# Patient Record
Sex: Female | Born: 1985 | Race: White | Hispanic: No | Marital: Married | Smoking: Never smoker
Health system: Southern US, Community
[De-identification: ages and names within clinical notes are randomized; demographics above are authoritative.]

## PROBLEM LIST (undated history)

## (undated) ENCOUNTER — Inpatient Hospital Stay (HOSPITAL_COMMUNITY): Payer: Self-pay

## (undated) DIAGNOSIS — C439 Malignant melanoma of skin, unspecified: Secondary | ICD-10-CM

## (undated) DIAGNOSIS — D6800 Von Willebrand disease, unspecified: Secondary | ICD-10-CM

## (undated) DIAGNOSIS — Z8619 Personal history of other infectious and parasitic diseases: Secondary | ICD-10-CM

## (undated) HISTORY — DX: Von Willebrand's disease: D68.0

## (undated) HISTORY — DX: Personal history of other infectious and parasitic diseases: Z86.19

## (undated) HISTORY — DX: Malignant melanoma of skin, unspecified: C43.9

## (undated) HISTORY — DX: Von Willebrand disease, unspecified: D68.00

---

## 2013-08-10 ENCOUNTER — Encounter: Payer: Self-pay | Admitting: Obstetrics & Gynecology

## 2013-08-16 ENCOUNTER — Encounter: Payer: Self-pay | Admitting: Family Medicine

## 2013-08-16 ENCOUNTER — Ambulatory Visit (INDEPENDENT_AMBULATORY_CARE_PROVIDER_SITE_OTHER): Payer: BC Managed Care – PPO | Admitting: Family Medicine

## 2013-08-16 VITALS — BP 126/73 | HR 100 | Ht 61.0 in | Wt 105.0 lb

## 2013-08-16 DIAGNOSIS — D680 Von Willebrand's disease: Secondary | ICD-10-CM

## 2013-08-16 DIAGNOSIS — Z01419 Encounter for gynecological examination (general) (routine) without abnormal findings: Secondary | ICD-10-CM

## 2013-08-16 DIAGNOSIS — Z124 Encounter for screening for malignant neoplasm of cervix: Secondary | ICD-10-CM

## 2013-08-16 LAB — COMPREHENSIVE METABOLIC PANEL
AST: 13 U/L (ref 0–37)
Alkaline Phosphatase: 49 U/L (ref 39–117)
BUN: 16 mg/dL (ref 6–23)
Glucose, Bld: 74 mg/dL (ref 70–99)
Potassium: 3.8 mEq/L (ref 3.5–5.3)
Sodium: 137 mEq/L (ref 135–145)
Total Bilirubin: 0.3 mg/dL (ref 0.3–1.2)
Total Protein: 7.9 g/dL (ref 6.0–8.3)

## 2013-08-16 LAB — CBC
Hemoglobin: 12.6 g/dL (ref 12.0–15.0)
MCH: 30.4 pg (ref 26.0–34.0)
MCHC: 33.8 g/dL (ref 30.0–36.0)
Platelets: 375 10*3/uL (ref 150–400)
RBC: 4.15 MIL/uL (ref 3.87–5.11)
RDW: 12.6 % (ref 11.5–15.5)

## 2013-08-16 LAB — LIPID PANEL
Cholesterol: 174 mg/dL (ref 0–200)
HDL: 67 mg/dL (ref >39)
LDL Cholesterol: 87 mg/dL (ref 0–99)
Total CHOL/HDL Ratio: 2.6 Ratio
VLDL: 20 mg/dL (ref 0–40)

## 2013-08-16 NOTE — Patient Instructions (Signed)
Preventive Care for Adults, Female A healthy lifestyle and preventive care can promote health and wellness. Preventive health guidelines for women include the following key practices.  A routine yearly physical is a good way to check with your caregiver about your health and preventive screening. It is a chance to share any concerns and updates on your health, and to receive a thorough exam.  Visit your dentist for a routine exam and preventive care every 6 months. Brush your teeth twice a day and floss once a day. Good oral hygiene prevents tooth decay and gum disease.  The frequency of eye exams is based on your age, health, family medical history, use of contact lenses, and other factors. Follow your caregiver's recommendations for frequency of eye exams.  Eat a healthy diet. Foods like vegetables, fruits, whole grains, low-fat dairy products, and lean protein foods contain the nutrients you need without too many calories. Decrease your intake of foods high in solid fats, added sugars, and salt. Eat the right amount of calories for you.Get information about a proper diet from your caregiver, if necessary.  Regular physical exercise is one of the most important things you can do for your health. Most adults should get at least 150 minutes of moderate-intensity exercise (any activity that increases your heart rate and causes you to sweat) each week. In addition, most adults need muscle-strengthening exercises on 2 or more days a week.  Maintain a healthy weight. The body mass index (BMI) is a screening tool to identify possible weight problems. It provides an estimate of body fat based on height and weight. Your caregiver can help determine your BMI, and can help you achieve or maintain a healthy weight.For adults 20 years and older:  A BMI below 18.5 is considered underweight.  A BMI of 18.5 to 24.9 is normal.  A BMI of 25 to 29.9 is considered overweight.  A BMI of 30 and above is  considered obese.  Maintain normal blood lipids and cholesterol levels by exercising and minimizing your intake of saturated fat. Eat a balanced diet with plenty of fruit and vegetables. Blood tests for lipids and cholesterol should begin at age 20 and be repeated every 5 years. If your lipid or cholesterol levels are high, you are over 50, or you are at high risk for heart disease, you may need your cholesterol levels checked more frequently.Ongoing high lipid and cholesterol levels should be treated with medicines if diet and exercise are not effective.  If you smoke, find out from your caregiver how to quit. If you do not use tobacco, do not start.  Lung cancer screening is recommended for adults aged 55 80 years who are at high risk for developing lung cancer because of a history of smoking. Yearly low-dose computed tomography (CT) is recommended for people who have at least a 30-pack-year history of smoking and are a current smoker or have quit within the past 15 years. A pack year of smoking is smoking an average of 1 pack of cigarettes a day for 1 year (for example: 1 pack a day for 30 years or 2 packs a day for 15 years). Yearly screening should continue until the smoker has stopped smoking for at least 15 years. Yearly screening should also be stopped for people who develop a health problem that would prevent them from having lung cancer treatment.  If you are pregnant, do not drink alcohol. If you are breastfeeding, be very cautious about drinking alcohol. If you are   not pregnant and choose to drink alcohol, do not exceed 1 drink per day. One drink is considered to be 12 ounces (355 mL) of beer, 5 ounces (148 mL) of wine, or 1.5 ounces (44 mL) of liquor.  Avoid use of street drugs. Do not share needles with anyone. Ask for help if you need support or instructions about stopping the use of drugs.  High blood pressure causes heart disease and increases the risk of stroke. Your blood pressure  should be checked at least every 1 to 2 years. Ongoing high blood pressure should be treated with medicines if weight loss and exercise are not effective.  If you are 55 to 28 years old, ask your caregiver if you should take aspirin to prevent strokes.  Diabetes screening involves taking a blood sample to check your fasting blood sugar level. This should be done once every 3 years, after age 45, if you are within normal weight and without risk factors for diabetes. Testing should be considered at a younger age or be carried out more frequently if you are overweight and have at least 1 risk factor for diabetes.  Breast cancer screening is essential preventive care for women. You should practice "breast self-awareness." This means understanding the normal appearance and feel of your breasts and may include breast self-examination. Any changes detected, no matter how small, should be reported to a caregiver. Women in their 20s and 30s should have a clinical breast exam (CBE) by a caregiver as part of a regular health exam every 1 to 3 years. After age 40, women should have a CBE every year. Starting at age 40, women should consider having a mammography (breast X-ray test) every year. Women who have a family history of breast cancer should talk to their caregiver about genetic screening. Women at a high risk of breast cancer should talk to their caregivers about having magnetic resonance imaging (MRI) and a mammography every year.  Breast cancer gene (BRCA)-related cancer risk assessment is recommended for women who have family members with BRCA-related cancers. BRCA-related cancers include breast, ovarian, tubal, and peritoneal cancers. Having family members with these cancers may be associated with an increased risk for harmful changes (mutations) in the breast cancer genes BRCA1 and BRCA2. Results of the assessment will determine the need for genetic counseling and BRCA1 and BRCA2 testing.  The Pap test is  a screening test for cervical cancer. A Pap test can show cell changes on the cervix that might become cervical cancer if left untreated. A Pap test is a procedure in which cells are obtained and examined from the lower end of the uterus (cervix).  Women should have a Pap test starting at age 21.  Between ages 21 and 29, Pap tests should be repeated every 2 years.  Beginning at age 30, you should have a Pap test every 3 years as long as the past 3 Pap tests have been normal.  Some women have medical problems that increase the chance of getting cervical cancer. Talk to your caregiver about these problems. It is especially important to talk to your caregiver if a new problem develops soon after your last Pap test. In these cases, your caregiver may recommend more frequent screening and Pap tests.  The above recommendations are the same for women who have or have not gotten the vaccine for human papillomavirus (HPV).  If you had a hysterectomy for a problem that was not cancer or a condition that could lead to cancer, then   you no longer need Pap tests. Even if you no longer need a Pap test, a regular exam is a good idea to make sure no other problems are starting.  If you are between ages 65 and 70, and you have had normal Pap tests going back 10 years, you no longer need Pap tests. Even if you no longer need a Pap test, a regular exam is a good idea to make sure no other problems are starting.  If you have had past treatment for cervical cancer or a condition that could lead to cancer, you need Pap tests and screening for cancer for at least 20 years after your treatment.  If Pap tests have been discontinued, risk factors (such as a new sexual partner) need to be reassessed to determine if screening should be resumed.  The HPV test is an additional test that may be used for cervical cancer screening. The HPV test looks for the virus that can cause the cell changes on the cervix. The cells collected  during the Pap test can be tested for HPV. The HPV test could be used to screen women aged 30 years and older, and should be used in women of any age who have unclear Pap test results. After the age of 30, women should have HPV testing at the same frequency as a Pap test.  Colorectal cancer can be detected and often prevented. Most routine colorectal cancer screening begins at the age of 50 and continues through age 75. However, your caregiver may recommend screening at an earlier age if you have risk factors for colon cancer. On a yearly basis, your caregiver may provide home test kits to check for hidden blood in the stool. Use of a small camera at the end of a tube, to directly examine the colon (sigmoidoscopy or colonoscopy), can detect the earliest forms of colorectal cancer. Talk to your caregiver about this at age 50, when routine screening begins. Direct examination of the colon should be repeated every 5 to 10 years through age 75, unless early forms of pre-cancerous polyps or small growths are found.  Hepatitis C blood testing is recommended for all people born from 1945 through 1965 and any individual with known risks for hepatitis C.  Practice safe sex. Use condoms and avoid high-risk sexual practices to reduce the spread of sexually transmitted infections (STIs). STIs include gonorrhea, chlamydia, syphilis, trichomonas, herpes, HPV, and human immunodeficiency virus (HIV). Herpes, HIV, and HPV are viral illnesses that have no cure. They can result in disability, cancer, and death. Sexually active women aged 25 and younger should be checked for chlamydia. Older women with new or multiple partners should also be tested for chlamydia. Testing for other STIs is recommended if you are sexually active and at increased risk.  Osteoporosis is a disease in which the bones lose minerals and strength with aging. This can result in serious bone fractures. The risk of osteoporosis can be identified using a  bone density scan. Women ages 65 and over and women at risk for fractures or osteoporosis should discuss screening with their caregivers. Ask your caregiver whether you should take a calcium supplement or vitamin D to reduce the rate of osteoporosis.  Menopause can be associated with physical symptoms and risks. Hormone replacement therapy is available to decrease symptoms and risks. You should talk to your caregiver about whether hormone replacement therapy is right for you.  Use sunscreen. Apply sunscreen liberally and repeatedly throughout the day. You should seek shade   when your shadow is shorter than you. Protect yourself by wearing long sleeves, pants, a wide-brimmed hat, and sunglasses year round, whenever you are outdoors.  Once a month, do a whole body skin exam, using a mirror to look at the skin on your back. Notify your caregiver of new moles, moles that have irregular borders, moles that are larger than a pencil eraser, or moles that have changed in shape or color.  Stay current with required immunizations.  Influenza vaccine. All adults should be immunized every year.  Tetanus, diphtheria, and acellular pertussis (Td, Tdap) vaccine. Pregnant women should receive 1 dose of Tdap vaccine during each pregnancy. The dose should be obtained regardless of the length of time since the last dose. Immunization is preferred during the 27th to 36th week of gestation. An adult who has not previously received Tdap or who does not know her vaccine status should receive 1 dose of Tdap. This initial dose should be followed by tetanus and diphtheria toxoids (Td) booster doses every 10 years. Adults with an unknown or incomplete history of completing a 3-dose immunization series with Td-containing vaccines should begin or complete a primary immunization series including a Tdap dose. Adults should receive a Td booster every 10 years.  Varicella vaccine. An adult without evidence of immunity to varicella  should receive 2 doses or a second dose if she has previously received 1 dose. Pregnant females who do not have evidence of immunity should receive the first dose after pregnancy. This first dose should be obtained before leaving the health care facility. The second dose should be obtained 4 8 weeks after the first dose.  Human papillomavirus (HPV) vaccine. Females aged 13 26 years who have not received the vaccine previously should obtain the 3-dose series. The vaccine is not recommended for use in pregnant females. However, pregnancy testing is not needed before receiving a dose. If a female is found to be pregnant after receiving a dose, no treatment is needed. In that case, the remaining doses should be delayed until after the pregnancy. Immunization is recommended for any person with an immunocompromised condition through the age of 26 years if she did not get any or all doses earlier. During the 3-dose series, the second dose should be obtained 4 8 weeks after the first dose. The third dose should be obtained 24 weeks after the first dose and 16 weeks after the second dose.  Zoster vaccine. One dose is recommended for adults aged 60 years or older unless certain conditions are present.  Measles, mumps, and rubella (MMR) vaccine. Adults born before 1957 generally are considered immune to measles and mumps. Adults born in 1957 or later should have 1 or more doses of MMR vaccine unless there is a contraindication to the vaccine or there is laboratory evidence of immunity to each of the three diseases. A routine second dose of MMR vaccine should be obtained at least 28 days after the first dose for students attending postsecondary schools, health care workers, or international travelers. People who received inactivated measles vaccine or an unknown type of measles vaccine during 1963 1967 should receive 2 doses of MMR vaccine. People who received inactivated mumps vaccine or an unknown type of mumps vaccine  before 1979 and are at high risk for mumps infection should consider immunization with 2 doses of MMR vaccine. For females of childbearing age, rubella immunity should be determined. If there is no evidence of immunity, females who are not pregnant should be vaccinated. If there   is no evidence of immunity, females who are pregnant should delay immunization until after pregnancy. Unvaccinated health care workers born before 1957 who lack laboratory evidence of measles, mumps, or rubella immunity or laboratory confirmation of disease should consider measles and mumps immunization with 2 doses of MMR vaccine or rubella immunization with 1 dose of MMR vaccine.  Pneumococcal 13-valent conjugate (PCV13) vaccine. When indicated, a person who is uncertain of her immunization history and has no record of immunization should receive the PCV13 vaccine. An adult aged 19 years or older who has certain medical conditions and has not been previously immunized should receive 1 dose of PCV13 vaccine. This PCV13 should be followed with a dose of pneumococcal polysaccharide (PPSV23) vaccine. The PPSV23 vaccine dose should be obtained at least 8 weeks after the dose of PCV13 vaccine. An adult aged 19 years or older who has certain medical conditions and previously received 1 or more doses of PPSV23 vaccine should receive 1 dose of PCV13. The PCV13 vaccine dose should be obtained 1 or more years after the last PPSV23 vaccine dose.  Pneumococcal polysaccharide (PPSV23) vaccine. When PCV13 is also indicated, PCV13 should be obtained first. All adults aged 65 years and older should be immunized. An adult younger than age 65 years who has certain medical conditions should be immunized. Any person who resides in a nursing home or long-term care facility should be immunized. An adult smoker should be immunized. People with an immunocompromised condition and certain other conditions should receive both PCV13 and PPSV23 vaccines. People  with human immunodeficiency virus (HIV) infection should be immunized as soon as possible after diagnosis. Immunization during chemotherapy or radiation therapy should be avoided. Routine use of PPSV23 vaccine is not recommended for American Indians, [place] Natives, or people younger than 65 years unless there are medical conditions that require PPSV23 vaccine. When indicated, people who have unknown immunization and have no record of immunization should receive PPSV23 vaccine. One-time revaccination 5 years after the first dose of PPSV23 is recommended for people aged 19 64 years who have chronic kidney failure, nephrotic syndrome, asplenia, or immunocompromised conditions. People who received 1 2 doses of PPSV23 before age 65 years should receive another dose of PPSV23 vaccine at age 65 years or later if at least 5 years have passed since the previous dose. Doses of PPSV23 are not needed for people immunized with PPSV23 at or after age 65 years.  Meningococcal vaccine. Adults with asplenia or persistent complement component deficiencies should receive 2 doses of quadrivalent meningococcal conjugate (MenACWY-D) vaccine. The doses should be obtained at least 2 months apart. Microbiologists working with certain meningococcal bacteria, military recruits, people at risk during an outbreak, and people who travel to or live in countries with a high rate of meningitis should be immunized. A first-year college student up through age 21 years who is living in a residence hall should receive a dose if she did not receive a dose on or after her 16th birthday. Adults who have certain high-risk conditions should receive one or more doses of vaccine.  Hepatitis A vaccine. Adults who wish to be protected from this disease, have certain high-risk conditions, work with hepatitis A-infected animals, work in hepatitis A research labs, or travel to or work in countries with a high rate of hepatitis A should be immunized. Adults  who were previously unvaccinated and who anticipate close contact with an international adoptee during the first 60 days after arrival in the [place] from a country   with a high rate of hepatitis A should be immunized.  Hepatitis B vaccine. Adults who wish to be protected from this disease, have certain high-risk conditions, may be exposed to blood or other infectious body fluids, are household contacts or sex partners of hepatitis B positive people, are clients or workers in certain care facilities, or travel to or work in countries with a high rate of hepatitis B should be immunized.  Haemophilus influenzae type b (Hib) vaccine. A previously unvaccinated person with asplenia or sickle cell disease or having a scheduled splenectomy should receive 1 dose of Hib vaccine. Regardless of previous immunization, a recipient of a hematopoietic stem cell transplant should receive a 3-dose series 6 12 months after her successful transplant. Hib vaccine is not recommended for adults with HIV infection. Preventive Services / Frequency Ages 19 to 39  Blood pressure check.** / Every 1 to 2 years.  Lipid and cholesterol check.** / Every 5 years beginning at age 20.  Clinical breast exam.** / Every 3 years for women in their 20s and 30s.  BRCA-related cancer risk assessment.** / For women who have family members with a BRCA-related cancer (breast, ovarian, tubal, or peritoneal cancers).  Pap test.** / Every 2 years from ages 21 through 29. Every 3 years starting at age 30 through age 65 or 70 with a history of 3 consecutive normal Pap tests.  HPV screening.** / Every 3 years from ages 30 through ages 65 to 70 with a history of 3 consecutive normal Pap tests.  Hepatitis C blood test.** / For any individual with known risks for hepatitis C.  Skin self-exam. / Monthly.  Influenza vaccine. / Every year.  Tetanus, diphtheria, and acellular pertussis (Tdap, Td) vaccine.** / Consult your caregiver. Pregnant  women should receive 1 dose of Tdap vaccine during each pregnancy. 1 dose of Td every 10 years.  Varicella vaccine.** / Consult your caregiver. Pregnant females who do not have evidence of immunity should receive the first dose after pregnancy.  HPV vaccine. / 3 doses over 6 months, if 26 and younger. The vaccine is not recommended for use in pregnant females. However, pregnancy testing is not needed before receiving a dose.  Measles, mumps, rubella (MMR) vaccine.** / You need at least 1 dose of MMR if you were born in 1957 or later. You may also need a 2nd dose. For females of childbearing age, rubella immunity should be determined. If there is no evidence of immunity, females who are not pregnant should be vaccinated. If there is no evidence of immunity, females who are pregnant should delay immunization until after pregnancy.  Pneumococcal 13-valent conjugate (PCV13) vaccine.** / Consult your caregiver.  Pneumococcal polysaccharide (PPSV23) vaccine.** / 1 to 2 doses if you smoke cigarettes or if you have certain conditions.  Meningococcal vaccine.** / 1 dose if you are age 19 to 21 years and a first-year college student living in a residence hall, or have one of several medical conditions, you need to get vaccinated against meningococcal disease. You may also need additional booster doses.  Hepatitis A vaccine.** / Consult your caregiver.  Hepatitis B vaccine.** / Consult your caregiver.  Haemophilus influenzae type b (Hib) vaccine.** / Consult your caregiver. Ages 40 to 64  Blood pressure check.** / Every 1 to 2 years.  Lipid and cholesterol check.** / Every 5 years beginning at age 20.  Lung cancer screening. / Every year if you are aged 55 80 years and have a 30-pack-year history of smoking and   currently smoke or have quit within the past 15 years. Yearly screening is stopped once you have quit smoking for at least 15 years or develop a health problem that would prevent you from having  lung cancer treatment.  Clinical breast exam.** / Every year after age 40.  BRCA-related cancer risk assessment.** / For women who have family members with a BRCA-related cancer (breast, ovarian, tubal, or peritoneal cancers).  Mammogram.** / Every year beginning at age 40 and continuing for as long as you are in good health. Consult with your caregiver.  Pap test.** / Every 3 years starting at age 30 through age 65 or 70 with a history of 3 consecutive normal Pap tests.  HPV screening.** / Every 3 years from ages 30 through ages 65 to 70 with a history of 3 consecutive normal Pap tests.  Fecal occult blood test (FOBT) of stool. / Every year beginning at age 50 and continuing until age 75. You may not need to do this test if you get a colonoscopy every 10 years.  Flexible sigmoidoscopy or colonoscopy.** / Every 5 years for a flexible sigmoidoscopy or every 10 years for a colonoscopy beginning at age 50 and continuing until age 75.  Hepatitis C blood test.** / For all people born from 1945 through 1965 and any individual with known risks for hepatitis C.  Skin self-exam. / Monthly.  Influenza vaccine. / Every year.  Tetanus, diphtheria, and acellular pertussis (Tdap/Td) vaccine.** / Consult your caregiver. Pregnant women should receive 1 dose of Tdap vaccine during each pregnancy. 1 dose of Td every 10 years.  Varicella vaccine.** / Consult your caregiver. Pregnant females who do not have evidence of immunity should receive the first dose after pregnancy.  Zoster vaccine.** / 1 dose for adults aged 60 years or older.  Measles, mumps, rubella (MMR) vaccine.** / You need at least 1 dose of MMR if you were born in 1957 or later. You may also need a 2nd dose. For females of childbearing age, rubella immunity should be determined. If there is no evidence of immunity, females who are not pregnant should be vaccinated. If there is no evidence of immunity, females who are pregnant should delay  immunization until after pregnancy.  Pneumococcal 13-valent conjugate (PCV13) vaccine.** / Consult your caregiver.  Pneumococcal polysaccharide (PPSV23) vaccine.** / 1 to 2 doses if you smoke cigarettes or if you have certain conditions.  Meningococcal vaccine.** / Consult your caregiver.  Hepatitis A vaccine.** / Consult your caregiver.  Hepatitis B vaccine.** / Consult your caregiver.  Haemophilus influenzae type b (Hib) vaccine.** / Consult your caregiver. Ages 65 and over  Blood pressure check.** / Every 1 to 2 years.  Lipid and cholesterol check.** / Every 5 years beginning at age 20.  Lung cancer screening. / Every year if you are aged 55 80 years and have a 30-pack-year history of smoking and currently smoke or have quit within the past 15 years. Yearly screening is stopped once you have quit smoking for at least 15 years or develop a health problem that would prevent you from having lung cancer treatment.  Clinical breast exam.** / Every year after age 40.  BRCA-related cancer risk assessment.** / For women who have family members with a BRCA-related cancer (breast, ovarian, tubal, or peritoneal cancers).  Mammogram.** / Every year beginning at age 40 and continuing for as long as you are in good health. Consult with your caregiver.  Pap test.** / Every 3 years starting at age   30 through age 65 or 70 with a 3 consecutive normal Pap tests. Testing can be stopped between 65 and 70 with 3 consecutive normal Pap tests and no abnormal Pap or HPV tests in the past 10 years.  HPV screening.** / Every 3 years from ages 30 through ages 65 or 70 with a history of 3 consecutive normal Pap tests. Testing can be stopped between 65 and 70 with 3 consecutive normal Pap tests and no abnormal Pap or HPV tests in the past 10 years.  Fecal occult blood test (FOBT) of stool. / Every year beginning at age 50 and continuing until age 75. You may not need to do this test if you get a colonoscopy  every 10 years.  Flexible sigmoidoscopy or colonoscopy.** / Every 5 years for a flexible sigmoidoscopy or every 10 years for a colonoscopy beginning at age 50 and continuing until age 75.  Hepatitis C blood test.** / For all people born from 1945 through 1965 and any individual with known risks for hepatitis C.  Osteoporosis screening.** / A one-time screening for women ages 65 and over and women at risk for fractures or osteoporosis.  Skin self-exam. / Monthly.  Influenza vaccine. / Every year.  Tetanus, diphtheria, and acellular pertussis (Tdap/Td) vaccine.** / 1 dose of Td every 10 years.  Varicella vaccine.** / Consult your caregiver.  Zoster vaccine.** / 1 dose for adults aged 60 years or older.  Pneumococcal 13-valent conjugate (PCV13) vaccine.** / Consult your caregiver.  Pneumococcal polysaccharide (PPSV23) vaccine.** / 1 dose for all adults aged 65 years and older.  Meningococcal vaccine.** / Consult your caregiver.  Hepatitis A vaccine.** / Consult your caregiver.  Hepatitis B vaccine.** / Consult your caregiver.  Haemophilus influenzae type b (Hib) vaccine.** / Consult your caregiver. ** Family history and personal history of risk and conditions may change your caregiver's recommendations. Document Released: 11/05/2001 Document Revised: 01/04/2013 Document Reviewed: 02/04/2011 [employer] Patient Information 2014 [employer].  

## 2013-08-16 NOTE — Progress Notes (Signed)
  Subjective:     Laura [name] is a 27 y.o. female and is here for a comprehensive physical exam. The patient reports no problems. On OC's which regulates cycles.  She has VWB disease. She is a [employer].  She is getting married in 03/2014.  History   Social History  . Marital Status: Single    Spouse Name: N/A    Number of Children: N/A  . Years of Education: N/A   Occupational History  . teacher [employer]   Social History Main Topics  . Smoking status: Never Smoker   . Smokeless tobacco: Never Used  . Alcohol Use: Yes     Comment: occ.  . Drug Use: No  . Sexual Activity: Yes    Partners: Male    Birth Control/ Protection: OCP   Other Topics Concern  . Not on file   Social History Narrative  . No narrative on file   No health maintenance topics applied.  The following portions of the patient's history were reviewed and updated as appropriate: allergies, current medications, past family history, past medical history, past social history, past surgical history and problem list.  Review of Systems A comprehensive review of systems was negative.   Objective:    BP 126/73  Pulse 100  Ht 5\' 1"  (1.549 m)  Wt 105 lb (47.628 kg)  BMI 19.85 kg/m2  LMP 08/11/2013 General appearance: alert, cooperative and appears stated age Head: Normocephalic, without obvious abnormality, atraumatic Neck: no adenopathy, supple, symmetrical, trachea midline and thyroid not enlarged, symmetric, no tenderness/mass/nodules Lungs: clear to auscultation bilaterally Breasts: normal appearance, no masses or tenderness Heart: regular rate and rhythm, S1, S2 normal, no murmur, click, rub or gallop Abdomen: soft, non-tender; bowel sounds normal; no masses,  no organomegaly Pelvic: cervix normal in appearance, external genitalia normal, no adnexal masses or tenderness, no cervical motion tenderness, uterus normal size, shape, and consistency and vagina normal without  discharge Extremities: extremities normal, atraumatic, no cyanosis or edema Pulses: 2+ and symmetric Skin: Skin color, texture, turgor normal. No rashes or lesions Lymph nodes: Cervical, supraclavicular, and axillary nodes normal. Neurologic: Grossly normal    Assessment:    Healthy female exam.      Plan:    Pap smear today Declines flu shot Annual blood work Advised about VWB disease and pregnancy. See After Visit Summary for Counseling Recommendations

## 2013-08-17 LAB — TSH: TSH: 1.337 u[IU]/mL (ref 0.350–4.500)

## 2013-11-01 ENCOUNTER — Telehealth: Payer: Self-pay | Admitting: *Deleted

## 2013-11-01 DIAGNOSIS — IMO0001 Reserved for inherently not codable concepts without codable children: Secondary | ICD-10-CM

## 2013-11-01 MED ORDER — NORETHINDRONE-ETH ESTRADIOL 1-35 MG-MCG PO TABS: 1.0000 | ORAL_TABLET | Freq: Every day | ORAL | Status: DC

## 2013-11-01 NOTE — Telephone Encounter (Signed)
Patient needs refill of her ocp.  She is not due for physical exam until next November.  rx called into pharmacy.

## 2014-12-20 ENCOUNTER — Encounter: Payer: Self-pay | Admitting: Family Medicine

## 2014-12-20 ENCOUNTER — Ambulatory Visit (INDEPENDENT_AMBULATORY_CARE_PROVIDER_SITE_OTHER): Payer: BC Managed Care – PPO | Admitting: Family Medicine

## 2014-12-20 ENCOUNTER — Other Ambulatory Visit (HOSPITAL_COMMUNITY): Admission: RE | Admit: 2014-12-20 | Payer: BC Managed Care – PPO | Source: Ambulatory Visit | Admitting: Family Medicine

## 2014-12-20 VITALS — BP 117/77 | HR 99 | Ht 61.0 in | Wt 107.0 lb

## 2014-12-20 DIAGNOSIS — Z3169 Encounter for other general counseling and advice on procreation: Secondary | ICD-10-CM

## 2014-12-20 DIAGNOSIS — Z1151 Encounter for screening for human papillomavirus (HPV): Secondary | ICD-10-CM | POA: Diagnosis not present

## 2014-12-20 DIAGNOSIS — Z01419 Encounter for gynecological examination (general) (routine) without abnormal findings: Secondary | ICD-10-CM

## 2014-12-20 DIAGNOSIS — Z3041 Encounter for surveillance of contraceptive pills: Secondary | ICD-10-CM | POA: Diagnosis not present

## 2014-12-20 DIAGNOSIS — Z124 Encounter for screening for malignant neoplasm of cervix: Secondary | ICD-10-CM

## 2014-12-20 DIAGNOSIS — Z3040 Encounter for surveillance of contraceptives, unspecified: Secondary | ICD-10-CM

## 2014-12-20 MED ORDER — NORETHINDRONE-ETH ESTRADIOL 1-35 MG-MCG PO TABS
1.0000 | ORAL_TABLET | Freq: Every day | ORAL | Status: DC
Start: 2014-12-20 — End: 2017-02-18

## 2014-12-20 NOTE — Progress Notes (Signed)
  Subjective:     Laura [name] is a 29 y.o. female and is here for a comprehensive physical exam. The patient reports no problems. Recently married.  On OC's for cycle control with VWB disease.  Interested in achieving pregnancy after school is out.  She is a Technical sales engineer.  History   Social History  . Marital Status: Single    Spouse Name: N/A  . Number of Children: N/A  . Years of Education: N/A   Occupational History  . 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 Smoking status: Never Smoker   . Smokeless tobacco: Never Used  . Alcohol Use: Yes     Comment: occ.  . Drug Use: No  . Sexual Activity:    Partners: Male    Birth Control/ Protection: OCP   Other Topics Concern  . Not on file   Social History Narrative   Health Maintenance  Topic Date Due  . HIV Screening  11/09/1999  . TETANUS/TDAP  11/09/2003  . INFLUENZA VACCINE  04/23/2014  . PAP SMEAR  08/16/2016    The following portions of the patient's history were reviewed and updated as appropriate: allergies, current medications, past family history, past medical history, past social history, past surgical history and problem list.  Review of Systems A comprehensive review of systems was negative.   Objective:    BP 117/77 mmHg  Pulse 99  Ht 5\' 1"  (1.549 m)  Wt 107 lb (48.535 kg)  BMI 20.23 kg/m2  LMP 11/29/2014 General appearance: alert, cooperative and appears stated age Head: Normocephalic, without obvious abnormality, atraumatic Neck: no adenopathy, supple, symmetrical, trachea midline and thyroid not enlarged, symmetric, no tenderness/mass/nodules Lungs: clear to auscultation bilaterally Breasts: normal appearance, no masses or tenderness Heart: regular rate and rhythm, S1, S2 normal, no murmur, click, rub or gallop Abdomen: soft, non-tender; bowel sounds normal; no masses,  no organomegaly Pelvic: cervix normal in appearance, external genitalia normal, no adnexal masses or  tenderness, no cervical motion tenderness, uterus normal size, shape, and consistency and vagina normal without discharge Extremities: extremities normal, atraumatic, no cyanosis or edema Pulses: 2+ and symmetric Skin: Skin color, texture, turgor normal. No rashes or lesions Lymph nodes: Cervical, supraclavicular, and axillary nodes normal. Neurologic: Grossly normal    Assessment:    Healthy female exam.      Plan:   Problem List Items Addressed This Visit    None    Visit Diagnoses    Encounter for routine gynecological examination    -  Primary    Screening for malignant neoplasm of cervix        Relevant Orders    Cytology - PAP    Encounter for surveillance of contraceptives        Relevant Medications    norethindrone-ethinyl estradiol 1/35 (NORTREL 1/35, 28,) tablet    Encounter for preconception consultation        Discussed beginning PNV's, and timing of stopping her OC's. Written information given         See After Visit Summary for Counseling Recommendations

## 2014-12-20 NOTE — Patient Instructions (Addendum)
Preparing for Pregnancy Before trying to become pregnant, make an appointment with your health care provider (preconception care). The goal is to help you have a healthy, safe pregnancy. At your first appointment, your health care provider will:   Do a complete physical exam, including a Pap test.  Take a complete medical history.  Give you advice and help you resolve any problems. PRECONCEPTION CHECKLIST Here is a list of the basics to cover with your health care provider at your preconception visit:  Medical history.  Tell your health care provider about any diseases you have had. Many diseases can affect your pregnancy.  Include your partner's medical history and family history.  Make sure you have been tested for sexually transmitted infections (STIs). These can affect your pregnancy. In some cases, they can be passed to your baby. Tell your health care provider about any history of STIs.  Make sure your health care provider knows about any previous problems you have had with conception or pregnancy.  Tell your health care provider about any medicine you take. This includes herbal supplements and over-the-counter medicines.  Make sure all your immunizations are up to date. You may need to make additional appointments.  Ask your health care provider if you need any vaccinations or if there are any you should avoid.  Diet.  It is especially important to eat a healthy, balanced diet with the right nutrients when you are pregnant.  Ask your health care provider to help you get to a healthy weight before pregnancy.  If you are overweight, you are at higher risk for certain complications. These include high blood pressure, diabetes, and preterm birth.  If you are underweight, you are more likely to have a low-birth-weight baby.  Lifestyle.  Tell your health care provider about lifestyle factors such as alcohol use, drug use, or smoking.  Describe any harmful substances you may  be exposed to at work or home. These can include chemicals, pesticides, and radiation.  Mental health.  Let your health care provider know if you have been feeling depressed or anxious.  Let your health care provider know if you have a history of substance abuse.  Let your health care provider know if you do not feel safe at home. HOME INSTRUCTIONS TO PREPARE FOR PREGNANCY Follow your health care provider's advice and instructions.   Keep an accurate record of your menstrual periods. This makes it easier for your health care provider to determine your baby's due date.  Begin taking prenatal vitamins and folic acid supplements daily. Take them as directed by your health care provider.  Eat a balanced diet. Get help from a nutrition counselor if you have questions or need help.  Get regular exercise. Try to be active for at least 30 minutes a day most days of the week.  Quit smoking, if you smoke.  Do not drink alcohol.  Do not take illegal drugs.  Get medical problems, such as diabetes or high blood pressure, under control.  If you have diabetes, make sure you do the following:  Have good blood sugar control. If you have type 1 diabetes, use multiple daily doses of insulin. Do not use split-dose or premixed insulin.  Have an eye exam by a qualified eye care professional trained in caring for people with diabetes.  Get evaluated by your health care provider for cardiovascular disease.  Get to a healthy weight. If you are overweight or obese, reduce your weight with the help of a qualified health   professional such as a registered dietitian. Ask your health care provider what the right weight range is for you. HOW DO I KNOW I AM PREGNANT? You may be pregnant if you have been sexually active and you miss your period. Symptoms of early pregnancy include:   Mild cramping.  Very light vaginal bleeding (spotting).  Feeling unusually tired.  Morning sickness. If you have any of  these symptoms, take a home pregnancy test. These tests look for a hormone called human chorionic gonadotropin (hCG) in your urine. Your body begins to make this hormone during early pregnancy. These tests are very accurate. Wait until at least the first day you miss your period to take one. If you get a positive result, call your health care provider to make appointments for prenatal care. WHAT SHOULD I DO IF I BECOME PREGNANT?  Make an appointment with your health care provider by week 12 of your pregnancy at the latest.  Do not smoke. Smoking can be harmful to your baby.  Do not drink alcoholic beverages. Alcohol is related to a number of birth defects.  Avoid toxic odors and chemicals.  You may continue to have sexual intercourse if it does not cause pain or other problems, such as vaginal bleeding. Document Released: 08/22/2008 Document Revised: 01/24/2014 Document Reviewed: 08/16/2013 [employer] Patient Information 2015 [employer]. This information is not intended to replace advice given to you by your health care provider. Make sure you discuss any questions you have with your health care provider. Preventive Care for Adults A healthy lifestyle and preventive care can promote health and wellness. Preventive health guidelines for women include the following key practices.  A routine yearly physical is a good way to check with your health care provider about your health and preventive screening. It is a chance to share any concerns and updates on your health and to receive a thorough exam.  Visit your dentist for a routine exam and preventive care every 6 months. Brush your teeth twice a day and floss once a day. Good oral hygiene prevents tooth decay and gum disease.  The frequency of eye exams is based on your age, health, family medical history, use of contact lenses, and other factors. Follow your health care provider's recommendations for frequency of eye exams.  Eat a healthy  diet. Foods like vegetables, fruits, whole grains, low-fat dairy products, and lean protein foods contain the nutrients you need without too many calories. Decrease your intake of foods high in solid fats, added sugars, and salt. Eat the right amount of calories for you.Get information about a proper diet from your health care provider, if necessary.  Regular physical exercise is one of the most important things you can do for your health. Most adults should get at least 150 minutes of moderate-intensity exercise (any activity that increases your heart rate and causes you to sweat) each week. In addition, most adults need muscle-strengthening exercises on 2 or more days a week.  Maintain a healthy weight. The body mass index (BMI) is a screening tool to identify possible weight problems. It provides an estimate of body fat based on height and weight. Your health care provider can find your BMI and can help you achieve or maintain a healthy weight.For adults 20 years and older:  A BMI below 18.5 is considered underweight.  A BMI of 18.5 to 24.9 is normal.  A BMI of 25 to 29.9 is considered overweight.  A BMI of 30 and above is considered obese.    Maintain normal blood lipids and cholesterol levels by exercising and minimizing your intake of saturated fat. Eat a balanced diet with plenty of fruit and vegetables. Blood tests for lipids and cholesterol should begin at age 20 and be repeated every 5 years. If your lipid or cholesterol levels are high, you are over 50, or you are at high risk for heart disease, you may need your cholesterol levels checked more frequently.Ongoing high lipid and cholesterol levels should be treated with medicines if diet and exercise are not working.  If you smoke, find out from your health care provider how to quit. If you do not use tobacco, do not start.  Lung cancer screening is recommended for adults aged 55-80 years who are at high risk for developing lung cancer  because of a history of smoking. A yearly low-dose CT scan of the lungs is recommended for people who have at least a 30-pack-year history of smoking and are a current smoker or have quit within the past 15 years. A pack year of smoking is smoking an average of 1 pack of cigarettes a day for 1 year (for example: 1 pack a day for 30 years or 2 packs a day for 15 years). Yearly screening should continue until the smoker has stopped smoking for at least 15 years. Yearly screening should be stopped for people who develop a health problem that would prevent them from having lung cancer treatment.  If you are pregnant, do not drink alcohol. If you are breastfeeding, be very cautious about drinking alcohol. If you are not pregnant and choose to drink alcohol, do not have more than 1 drink per day. One drink is considered to be 12 ounces (355 mL) of beer, 5 ounces (148 mL) of wine, or 1.5 ounces (44 mL) of liquor.  Avoid use of street drugs. Do not share needles with anyone. Ask for help if you need support or instructions about stopping the use of drugs.  High blood pressure causes heart disease and increases the risk of stroke. Your blood pressure should be checked at least every 1 to 2 years. Ongoing high blood pressure should be treated with medicines if weight loss and exercise do not work.  If you are 55-79 years old, ask your health care provider if you should take aspirin to prevent strokes.  Diabetes screening involves taking a blood sample to check your fasting blood sugar level. This should be done once every 3 years, after age 45, if you are within normal weight and without risk factors for diabetes. Testing should be considered at a younger age or be carried out more frequently if you are overweight and have at least 1 risk factor for diabetes.  Breast cancer screening is essential preventive care for women. You should practice "breast self-awareness." This means understanding the normal appearance  and feel of your breasts and may include breast self-examination. Any changes detected, no matter how small, should be reported to a health care provider. Women in their 20s and 30s should have a clinical breast exam (CBE) by a health care provider as part of a regular health exam every 1 to 3 years. After age 40, women should have a CBE every year. Starting at age 40, women should consider having a mammogram (breast X-ray test) every year. Women who have a family history of breast cancer should talk to their health care provider about genetic screening. Women at a high risk of breast cancer should talk to their health care providers   about having an MRI and a mammogram every year.  Breast cancer gene (BRCA)-related cancer risk assessment is recommended for women who have family members with BRCA-related cancers. BRCA-related cancers include breast, ovarian, tubal, and peritoneal cancers. Having family members with these cancers may be associated with an increased risk for harmful changes (mutations) in the breast cancer genes BRCA1 and BRCA2. Results of the assessment will determine the need for genetic counseling and BRCA1 and BRCA2 testing.  Routine pelvic exams to screen for cancer are no longer recommended for nonpregnant women who are considered low risk for cancer of the pelvic organs (ovaries, uterus, and vagina) and who do not have symptoms. Ask your health care provider if a screening pelvic exam is right for you.  If you have had past treatment for cervical cancer or a condition that could lead to cancer, you need Pap tests and screening for cancer for at least 20 years after your treatment. If Pap tests have been discontinued, your risk factors (such as having a new sexual partner) need to be reassessed to determine if screening should be resumed. Some women have medical problems that increase the chance of getting cervical cancer. In these cases, your health care provider may recommend more  frequent screening and Pap tests.  The HPV test is an additional test that may be used for cervical cancer screening. The HPV test looks for the virus that can cause the cell changes on the cervix. The cells collected during the Pap test can be tested for HPV. The HPV test could be used to screen women aged 54 years and older, and should be used in women of any age who have unclear Pap test results. After the age of 39, women should have HPV testing at the same frequency as a Pap test.  Colorectal cancer can be detected and often prevented. Most routine colorectal cancer screening begins at the age of 36 years and continues through age 74 years. However, your health care provider may recommend screening at an earlier age if you have risk factors for colon cancer. On a yearly basis, your health care provider may provide home test kits to check for hidden blood in the stool. Use of a small camera at the end of a tube, to directly examine the colon (sigmoidoscopy or colonoscopy), can detect the earliest forms of colorectal cancer. Talk to your health care provider about this at age 35, when routine screening begins. Direct exam of the colon should be repeated every 5-10 years through age 66 years, unless early forms of pre-cancerous polyps or small growths are found.  People who are at an increased risk for hepatitis B should be screened for this virus. You are considered at high risk for hepatitis B if:  You were born in a country where hepatitis B occurs often. Talk with your health care provider about which countries are considered high risk.  Your parents were born in a high-risk country and you have not received a shot to protect against hepatitis B (hepatitis B vaccine).  You have HIV or AIDS.  You use needles to inject street drugs.  You live with, or have sex with, someone who has hepatitis B.  You get hemodialysis treatment.  You take certain medicines for conditions like cancer, organ  transplantation, and autoimmune conditions.  Hepatitis C blood testing is recommended for all people born from [birth year] through 1966 and any individual with known risks for hepatitis C.  Practice safe sex. Use condoms and avoid  high-risk sexual practices to reduce the spread of sexually transmitted infections (STIs). STIs include gonorrhea, chlamydia, syphilis, trichomonas, herpes, HPV, and human immunodeficiency virus (HIV). Herpes, HIV, and HPV are viral illnesses that have no cure. They can result in disability, cancer, and death.  You should be screened for sexually transmitted illnesses (STIs) including gonorrhea and chlamydia if:  You are sexually active and are younger than 24 years.  You are older than 24 years and your health care provider tells you that you are at risk for this type of infection.  Your sexual activity has changed since you were last screened and you are at an increased risk for chlamydia or gonorrhea. Ask your health care provider if you are at risk.  If you are at risk of being infected with HIV, it is recommended that you take a prescription medicine daily to prevent HIV infection. This is called preexposure prophylaxis (PrEP). You are considered at risk if:  You are a heterosexual woman, are sexually active, and are at increased risk for HIV infection.  You take drugs by injection.  You are sexually active with a partner who has HIV.  Talk with your health care provider about whether you are at high risk of being infected with HIV. If you choose to begin PrEP, you should first be tested for HIV. You should then be tested every 3 months for as long as you are taking PrEP.  Osteoporosis is a disease in which the bones lose minerals and strength with aging. This can result in serious bone fractures or breaks. The risk of osteoporosis can be identified using a bone density scan. Women ages 65 years and over and women at risk for fractures or osteoporosis should discuss  screening with their health care providers. Ask your health care provider whether you should take a calcium supplement or vitamin D to reduce the rate of osteoporosis.  Menopause can be associated with physical symptoms and risks. Hormone replacement therapy is available to decrease symptoms and risks. You should talk to your health care provider about whether hormone replacement therapy is right for you.  Use sunscreen. Apply sunscreen liberally and repeatedly throughout the day. You should seek shade when your shadow is shorter than you. Protect yourself by wearing long sleeves, pants, a wide-brimmed hat, and sunglasses year round, whenever you are outdoors.  Once a month, do a whole body skin exam, using a mirror to look at the skin on your back. Tell your health care provider of new moles, moles that have irregular borders, moles that are larger than a pencil eraser, or moles that have changed in shape or color.  Stay current with required vaccines (immunizations).  Influenza vaccine. All adults should be immunized every year.  Tetanus, diphtheria, and acellular pertussis (Td, Tdap) vaccine. Pregnant women should receive 1 dose of Tdap vaccine during each pregnancy. The dose should be obtained regardless of the length of time since the last dose. Immunization is preferred during the 27th-36th week of gestation. An adult who has not previously received Tdap or who does not know her vaccine status should receive 1 dose of Tdap. This initial dose should be followed by tetanus and diphtheria toxoids (Td) booster doses every 10 years. Adults with an unknown or incomplete history of completing a 3-dose immunization series with Td-containing vaccines should begin or complete a primary immunization series including a Tdap dose. Adults should receive a Td booster every 10 years.  Varicella vaccine. An adult without evidence of immunity   to varicella should receive 2 doses or a second dose if she has  previously received 1 dose. Pregnant females who do not have evidence of immunity should receive the first dose after pregnancy. This first dose should be obtained before leaving the health care facility. The second dose should be obtained 4-8 weeks after the first dose.  Human papillomavirus (HPV) vaccine. Females aged 13-26 years who have not received the vaccine previously should obtain the 3-dose series. The vaccine is not recommended for use in pregnant females. However, pregnancy testing is not needed before receiving a dose. If a female is found to be pregnant after receiving a dose, no treatment is needed. In that case, the remaining doses should be delayed until after the pregnancy. Immunization is recommended for any person with an immunocompromised condition through the age of 26 years if she did not get any or all doses earlier. During the 3-dose series, the second dose should be obtained 4-8 weeks after the first dose. The third dose should be obtained 24 weeks after the first dose and 16 weeks after the second dose.  Zoster vaccine. One dose is recommended for adults aged 60 years or older unless certain conditions are present.  Measles, mumps, and rubella (MMR) vaccine. Adults born before 1957 generally are considered immune to measles and mumps. Adults born in 1957 or later should have 1 or more doses of MMR vaccine unless there is a contraindication to the vaccine or there is laboratory evidence of immunity to each of the three diseases. A routine second dose of MMR vaccine should be obtained at least 28 days after the first dose for students attending postsecondary schools, health care workers, or international travelers. People who received inactivated measles vaccine or an unknown type of measles vaccine during 1963-1967 should receive 2 doses of MMR vaccine. People who received inactivated mumps vaccine or an unknown type of mumps vaccine before 1979 and are at high risk for mumps  infection should consider immunization with 2 doses of MMR vaccine. For females of childbearing age, rubella immunity should be determined. If there is no evidence of immunity, females who are not pregnant should be vaccinated. If there is no evidence of immunity, females who are pregnant should delay immunization until after pregnancy. Unvaccinated health care workers born before 1957 who lack laboratory evidence of measles, mumps, or rubella immunity or laboratory confirmation of disease should consider measles and mumps immunization with 2 doses of MMR vaccine or rubella immunization with 1 dose of MMR vaccine.  Pneumococcal 13-valent conjugate (PCV13) vaccine. When indicated, a person who is uncertain of her immunization history and has no record of immunization should receive the PCV13 vaccine. An adult aged 19 years or older who has certain medical conditions and has not been previously immunized should receive 1 dose of PCV13 vaccine. This PCV13 should be followed with a dose of pneumococcal polysaccharide (PPSV23) vaccine. The PPSV23 vaccine dose should be obtained at least 8 weeks after the dose of PCV13 vaccine. An adult aged 19 years or older who has certain medical conditions and previously received 1 or more doses of PPSV23 vaccine should receive 1 dose of PCV13. The PCV13 vaccine dose should be obtained 1 or more years after the last PPSV23 vaccine dose.  Pneumococcal polysaccharide (PPSV23) vaccine. When PCV13 is also indicated, PCV13 should be obtained first. All adults aged 65 years and older should be immunized. An adult younger than age 65 years who has certain medical conditions should be immunized.   Any person who resides in a nursing home or long-term care facility should be immunized. An adult smoker should be immunized. People with an immunocompromised condition and certain other conditions should receive both PCV13 and PPSV23 vaccines. People with human immunodeficiency virus (HIV)  infection should be immunized as soon as possible after diagnosis. Immunization during chemotherapy or radiation therapy should be avoided. Routine use of PPSV23 vaccine is not recommended for American Indians, [place] Natives, or people younger than 65 years unless there are medical conditions that require PPSV23 vaccine. When indicated, people who have unknown immunization and have no record of immunization should receive PPSV23 vaccine. One-time revaccination 5 years after the first dose of PPSV23 is recommended for people aged 19-64 years who have chronic kidney failure, nephrotic syndrome, asplenia, or immunocompromised conditions. People who received 1-2 doses of PPSV23 before age 65 years should receive another dose of PPSV23 vaccine at age 65 years or later if at least 5 years have passed since the previous dose. Doses of PPSV23 are not needed for people immunized with PPSV23 at or after age 65 years.  Meningococcal vaccine. Adults with asplenia or persistent complement component deficiencies should receive 2 doses of quadrivalent meningococcal conjugate (MenACWY-D) vaccine. The doses should be obtained at least 2 months apart. Microbiologists working with certain meningococcal bacteria, military recruits, people at risk during an outbreak, and people who travel to or live in countries with a high rate of meningitis should be immunized. A first-year college student up through age 21 years who is living in a residence hall should receive a dose if she did not receive a dose on or after her 16th birthday. Adults who have certain high-risk conditions should receive one or more doses of vaccine.  Hepatitis A vaccine. Adults who wish to be protected from this disease, have certain high-risk conditions, work with hepatitis A-infected animals, work in hepatitis A research labs, or travel to or work in countries with a high rate of hepatitis A should be immunized. Adults who were previously unvaccinated and who  anticipate close contact with an international adoptee during the first 60 days after arrival in the [place] from a country with a high rate of hepatitis A should be immunized.  Hepatitis B vaccine. Adults who wish to be protected from this disease, have certain high-risk conditions, may be exposed to blood or other infectious body fluids, are household contacts or sex partners of hepatitis B positive people, are clients or workers in certain care facilities, or travel to or work in countries with a high rate of hepatitis B should be immunized.  Haemophilus influenzae type b (Hib) vaccine. A previously unvaccinated person with asplenia or sickle cell disease or having a scheduled splenectomy should receive 1 dose of Hib vaccine. Regardless of previous immunization, a recipient of a hematopoietic stem cell transplant should receive a 3-dose series 6-12 months after her successful transplant. Hib vaccine is not recommended for adults with HIV infection. Preventive Services / Frequency Ages 19 to 39 years  Blood pressure check.** / Every 1 to 2 years.  Lipid and cholesterol check.** / Every 5 years beginning at age 20.  Clinical breast exam.** / Every 3 years for women in their 20s and 30s.  BRCA-related cancer risk assessment.** / For women who have family members with a BRCA-related cancer (breast, ovarian, tubal, or peritoneal cancers).  Pap test.** / Every 2 years from ages 21 through 29. Every 3 years starting at age 30 through age 65 or 70   with a history of 3 consecutive normal Pap tests.  HPV screening.** / Every 3 years from ages 30 through ages 65 to 70 with a history of 3 consecutive normal Pap tests.  Hepatitis C blood test.** / For any individual with known risks for hepatitis C.  Skin self-exam. / Monthly.  Influenza vaccine. / Every year.  Tetanus, diphtheria, and acellular pertussis (Tdap, Td) vaccine.** / Consult your health care provider. Pregnant women should receive 1  dose of Tdap vaccine during each pregnancy. 1 dose of Td every 10 years.  Varicella vaccine.** / Consult your health care provider. Pregnant females who do not have evidence of immunity should receive the first dose after pregnancy.  HPV vaccine. / 3 doses over 6 months, if 26 and younger. The vaccine is not recommended for use in pregnant females. However, pregnancy testing is not needed before receiving a dose.  Measles, mumps, rubella (MMR) vaccine.** / You need at least 1 dose of MMR if you were born in 1957 or later. You may also need a 2nd dose. For females of childbearing age, rubella immunity should be determined. If there is no evidence of immunity, females who are not pregnant should be vaccinated. If there is no evidence of immunity, females who are pregnant should delay immunization until after pregnancy.  Pneumococcal 13-valent conjugate (PCV13) vaccine.** / Consult your health care provider.  Pneumococcal polysaccharide (PPSV23) vaccine.** / 1 to 2 doses if you smoke cigarettes or if you have certain conditions.  Meningococcal vaccine.** / 1 dose if you are age 19 to 21 years and a first-year college student living in a residence hall, or have one of several medical conditions, you need to get vaccinated against meningococcal disease. You may also need additional booster doses.  Hepatitis A vaccine.** / Consult your health care provider.  Hepatitis B vaccine.** / Consult your health care provider.  Haemophilus influenzae type b (Hib) vaccine.** / Consult your health care provider. Ages 40 to 64 years  Blood pressure check.** / Every 1 to 2 years.  Lipid and cholesterol check.** / Every 5 years beginning at age 20 years.  Lung cancer screening. / Every year if you are aged 55-80 years and have a 30-pack-year history of smoking and currently smoke or have quit within the past 15 years. Yearly screening is stopped once you have quit smoking for at least 15 years or develop a  health problem that would prevent you from having lung cancer treatment.  Clinical breast exam.** / Every year after age 40 years.  BRCA-related cancer risk assessment.** / For women who have family members with a BRCA-related cancer (breast, ovarian, tubal, or peritoneal cancers).  Mammogram.** / Every year beginning at age 40 years and continuing for as long as you are in good health. Consult with your health care provider.  Pap test.** / Every 3 years starting at age 30 years through age 65 or 70 years with a history of 3 consecutive normal Pap tests.  HPV screening.** / Every 3 years from ages 30 years through ages 65 to 70 years with a history of 3 consecutive normal Pap tests.  Fecal occult blood test (FOBT) of stool. / Every year beginning at age 50 years and continuing until age 75 years. You may not need to do this test if you get a colonoscopy every 10 years.  Flexible sigmoidoscopy or colonoscopy.** / Every 5 years for a flexible sigmoidoscopy or every 10 years for a colonoscopy beginning at age 50 years   and continuing until age 75 years.  Hepatitis C blood test.** / For all people born from 1945 through 1965 and any individual with known risks for hepatitis C.  Skin self-exam. / Monthly.  Influenza vaccine. / Every year.  Tetanus, diphtheria, and acellular pertussis (Tdap/Td) vaccine.** / Consult your health care provider. Pregnant women should receive 1 dose of Tdap vaccine during each pregnancy. 1 dose of Td every 10 years.  Varicella vaccine.** / Consult your health care provider. Pregnant females who do not have evidence of immunity should receive the first dose after pregnancy.  Zoster vaccine.** / 1 dose for adults aged 60 years or older.  Measles, mumps, rubella (MMR) vaccine.** / You need at least 1 dose of MMR if you were born in 1957 or later. You may also need a 2nd dose. For females of childbearing age, rubella immunity should be determined. If there is no  evidence of immunity, females who are not pregnant should be vaccinated. If there is no evidence of immunity, females who are pregnant should delay immunization until after pregnancy.  Pneumococcal 13-valent conjugate (PCV13) vaccine.** / Consult your health care provider.  Pneumococcal polysaccharide (PPSV23) vaccine.** / 1 to 2 doses if you smoke cigarettes or if you have certain conditions.  Meningococcal vaccine.** / Consult your health care provider.  Hepatitis A vaccine.** / Consult your health care provider.  Hepatitis B vaccine.** / Consult your health care provider.  Haemophilus influenzae type b (Hib) vaccine.** / Consult your health care provider. Ages 65 years and over  Blood pressure check.** / Every 1 to 2 years.  Lipid and cholesterol check.** / Every 5 years beginning at age 20 years.  Lung cancer screening. / Every year if you are aged 55-80 years and have a 30-pack-year history of smoking and currently smoke or have quit within the past 15 years. Yearly screening is stopped once you have quit smoking for at least 15 years or develop a health problem that would prevent you from having lung cancer treatment.  Clinical breast exam.** / Every year after age 40 years.  BRCA-related cancer risk assessment.** / For women who have family members with a BRCA-related cancer (breast, ovarian, tubal, or peritoneal cancers).  Mammogram.** / Every year beginning at age 40 years and continuing for as long as you are in good health. Consult with your health care provider.  Pap test.** / Every 3 years starting at age 30 years through age 65 or 70 years with 3 consecutive normal Pap tests. Testing can be stopped between 65 and 70 years with 3 consecutive normal Pap tests and no abnormal Pap or HPV tests in the past 10 years.  HPV screening.** / Every 3 years from ages 30 years through ages 65 or 70 years with a history of 3 consecutive normal Pap tests. Testing can be stopped between 65  and 70 years with 3 consecutive normal Pap tests and no abnormal Pap or HPV tests in the past 10 years.  Fecal occult blood test (FOBT) of stool. / Every year beginning at age 50 years and continuing until age 75 years. You may not need to do this test if you get a colonoscopy every 10 years.  Flexible sigmoidoscopy or colonoscopy.** / Every 5 years for a flexible sigmoidoscopy or every 10 years for a colonoscopy beginning at age 50 years and continuing until age 75 years.  Hepatitis C blood test.** / For all people born from 1945 through 1965 and any individual with known risks   for hepatitis C.  Osteoporosis screening.** / A one-time screening for women ages 65 years and over and women at risk for fractures or osteoporosis.  Skin self-exam. / Monthly.  Influenza vaccine. / Every year.  Tetanus, diphtheria, and acellular pertussis (Tdap/Td) vaccine.** / 1 dose of Td every 10 years.  Varicella vaccine.** / Consult your health care provider.  Zoster vaccine.** / 1 dose for adults aged 60 years or older.  Pneumococcal 13-valent conjugate (PCV13) vaccine.** / Consult your health care provider.  Pneumococcal polysaccharide (PPSV23) vaccine.** / 1 dose for all adults aged 65 years and older.  Meningococcal vaccine.** / Consult your health care provider.  Hepatitis A vaccine.** / Consult your health care provider.  Hepatitis B vaccine.** / Consult your health care provider.  Haemophilus influenzae type b (Hib) vaccine.** / Consult your health care provider. ** Family history and personal history of risk and conditions may change your health care provider's recommendations. Document Released: 11/05/2001 Document Revised: 01/24/2014 Document Reviewed: 02/04/2011 ExitCare Patient Information 2015 [employer]. This information is not intended to replace advice given to you by your health care provider. Make sure you discuss any questions you have with your health care provider.  

## 2014-12-23 LAB — CYTOLOGY - PAP

## 2014-12-28 ENCOUNTER — Other Ambulatory Visit: Payer: Self-pay | Admitting: Family Medicine

## 2017-02-17 NOTE — Progress Notes (Signed)
Last pap 11/2014 - normal

## 2017-02-18 ENCOUNTER — Ambulatory Visit (INDEPENDENT_AMBULATORY_CARE_PROVIDER_SITE_OTHER): Payer: BC Managed Care – PPO | Admitting: Family Medicine

## 2017-02-18 ENCOUNTER — Encounter: Payer: Self-pay | Admitting: Family Medicine

## 2017-02-18 VITALS — BP 105/71 | HR 99 | Resp 18 | Ht 61.0 in | Wt 100.0 lb

## 2017-02-18 DIAGNOSIS — Z1151 Encounter for screening for human papillomavirus (HPV): Secondary | ICD-10-CM

## 2017-02-18 DIAGNOSIS — Z01419 Encounter for gynecological examination (general) (routine) without abnormal findings: Secondary | ICD-10-CM | POA: Diagnosis not present

## 2017-02-18 DIAGNOSIS — Z124 Encounter for screening for malignant neoplasm of cervix: Secondary | ICD-10-CM | POA: Diagnosis not present

## 2017-02-18 NOTE — Patient Instructions (Signed)
Preparing for Pregnancy If you are considering becoming pregnant, make an appointment to see your regular health care provider to learn how to prepare for a safe and healthy pregnancy (preconception care). During a preconception care visit, your health care provider will:  Do a complete physical exam, including a Pap test.  Take a complete medical history.  Give you information, answer your questions, and help you resolve problems.  Preconception checklist Medical history  Tell your health care provider about any current or past medical conditions. Your pregnancy or your ability to become pregnant may be affected by chronic conditions, such as diabetes, chronic hypertension, and thyroid problems.  Include your family's medical history as well as your partner's medical history.  Tell your health care provider about any history of STIs (sexually transmitted infections).These can affect your pregnancy. In some cases, they can be passed to your baby. Discuss any concerns that you have about STIs.  If indicated, discuss the benefits of genetic testing. This testing will show whether there are any genetic conditions that may be passed from you or your partner to your baby.  Tell your health care provider about: ? Any problems you have had with conception or pregnancy. ? Any medicines you take. These include vitamins, herbal supplements, and over-the-counter medicines. ? Your history of immunizations. Discuss any vaccinations that you may need.  Diet  Ask your health care provider what to include in a healthy diet that has a balance of nutrients. This is especially important when you are pregnant or preparing to become pregnant.  Ask your health care provider to help you reach a healthy weight before pregnancy. ? If you are overweight, you may be at higher risk for certain complications, such as high blood pressure, diabetes, and preterm birth. ? If you are underweight, you are more likely  to have a baby who has a low birth weight.  Lifestyle, work, and home  Let your health care provider know: ? About any lifestyle habits that you have, such as alcohol use, drug use, or smoking. ? About recreational activities that may put you at risk during pregnancy, such as downhill skiing and certain exercise programs. ? Tell your health care provider about any international travel, especially any travel to places with an active Zika virus outbreak. ? About harmful substances that you may be exposed to at work or at home. These include chemicals, pesticides, radiation, or even litter boxes. ? If you do not feel safe at home.  Mental health  Tell your health care provider about: ? Any history of mental health conditions, including feelings of depression, sadness, or anxiety. ? Any medicines that you take for a mental health condition. These include herbs and supplements.  Home instructions to prepare for pregnancy Lifestyle  Eat a balanced diet. This includes fresh fruits and vegetables, whole grains, lean meats, low-fat dairy products, healthy fats, and foods that are high in fiber. Ask to meet with a nutritionist or registered dietitian for assistance with meal planning and goals.  Get regular exercise. Try to be active for at least 30 minutes a day on most days of the week. Ask your health care provider which activities are safe during pregnancy.  Do not use any products that contain nicotine or tobacco, such as cigarettes and e-cigarettes. If you need help quitting, ask your health care provider.  Do not drink alcohol.  Do not take illegal drugs.  Maintain a healthy weight. Ask your health care provider what weight range is   right for you.  General instructions  Keep an accurate record of your menstrual periods. This makes it easier for your health care provider to determine your baby's due date.  Begin taking prenatal vitamins and folic acid supplements daily as directed by  your health care provider.  Manage any chronic conditions, such as high blood pressure and diabetes, as told by your health care provider. This is important.  How do I know that I am pregnant? You may be pregnant if you have been sexually active and you miss your period. Symptoms of early pregnancy include:  Mild cramping.  Very light vaginal bleeding (spotting).  Feeling unusually tired.  Nausea and vomiting (morning sickness).  If you have any of these symptoms and you suspect that you might be pregnant, you can take a home pregnancy test. These tests check for a hormone in your urine (human chorionic gonadotropin, or hCG). A woman's body begins to make this hormone during early pregnancy. These tests are very accurate. Wait until at least the first day after you miss your period to take one. If the test shows that you are pregnant (you get a positive result), call your health care provider to make an appointment for prenatal care. What should I do if I become pregnant?  Make an appointment with your health care provider as soon as you suspect you are pregnant.  Do not use any products that contain nicotine, such as cigarettes, chewing tobacco, and e-cigarettes. If you need help quitting, ask your health care provider.  Do not drink alcoholic beverages. Alcohol is related to a number of birth defects.  Avoid toxic odors and chemicals.  You may continue to have sexual intercourse if it does not cause pain or other problems, such as vaginal bleeding. This information is not intended to replace advice given to you by your health care provider. Make sure you discuss any questions you have with your health care provider. Document Released: 08/22/2008 Document Revised: 05/07/2016 Document Reviewed: 03/31/2016 Elsevier Interactive Patient Education  2017 Else[employer].  

## 2017-02-18 NOTE — Progress Notes (Signed)
  Subjective:     Laura [name] is a 31 y.o. female and is here for a comprehensive physical exam. The patient reports no problems. Off her OC's x 2 years but has not achieved pregnancy yet. Reports regular cycles but not timed intercourse. She is not on a PNV. She has not had exceedingly heavy cycles despite a diagnosis of VWB disease. 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 Husband works for [employer].  Social History   Social History  . Marital status: Single    Spouse name: N/A  . Number of children: N/A  . Years of education: N/A   Occupational History  . teacher [employer] History Main Topics  . Smoking status: Never Smoker  . Smokeless tobacco: Never Used  . Alcohol use Yes     Comment: occ.  . Drug use: No  . Sexual activity: Yes    Partners: Male   Other Topics Concern  . Not on file   Social History Narrative  . No narrative on file   Health Maintenance  Topic Date Due  . HIV Screening  11/09/1999  . TETANUS/TDAP  11/09/2003  . INFLUENZA VACCINE  04/23/2017  . PAP SMEAR  12/19/2017    The following portions of the patient's history were reviewed and updated as appropriate: allergies, current medications, past family history, past medical history, past social history, past surgical history and problem list.  Review of Systems Pertinent items noted in HPI and remainder of comprehensive ROS otherwise negative.   Objective:    BP 105/71 (BP Location: Left Arm, Patient Position: Sitting, Cuff Size: Normal)   Pulse 99   Resp 18   Ht 5\' 1"  (1.549 m)   Wt 100 lb (45.4 kg)   LMP 01/24/2017   BMI 18.89 kg/m  General appearance: alert, cooperative and appears stated age Head: Normocephalic, without obvious abnormality, atraumatic Neck: no adenopathy, supple, symmetrical, trachea midline and thyroid not enlarged, symmetric, no tenderness/mass/nodules Lungs: clear to auscultation bilaterally Breasts: normal appearance, no masses or tenderness Heart: regular  rate and rhythm, S1, S2 normal, no murmur, click, rub or gallop Abdomen: soft, non-tender; bowel sounds normal; no masses,  no organomegaly Pelvic: cervix normal in appearance, external genitalia normal, no adnexal masses or tenderness, no cervical motion tenderness, uterus normal size, shape, and consistency and vagina normal without discharge Extremities: extremities normal, atraumatic, no cyanosis or edema Pulses: 2+ and symmetric Skin: Skin color, texture, turgor normal. No rashes or lesions Lymph nodes: Cervical, supraclavicular, and axillary nodes normal. Neurologic: Grossly normal    Assessment:    Healthy female exam.      Plan:   Problem List Items Addressed This Visit    None    Visit Diagnoses    Well woman exam with routine gynecological exam    -  Primary   Relevant Medications   ibuprofen (ADVIL,MOTRIN) 800 MG tablet   Other Relevant Orders   CBC   Comprehensive metabolic panel   Lipid panel   TSH   Screening for malignant neoplasm of cervix       Relevant Orders   Cytology - PAP    Return if interested in pursuing fertility work-up.     See After Visit Summary for Counseling Recommendations

## 2017-02-19 LAB — CBC
HEMOGLOBIN: 13 g/dL (ref 11.1–15.9)
Hematocrit: 39.3 % (ref 34.0–46.6)
MCH: 30.1 pg (ref 26.6–33.0)
MCHC: 33.1 g/dL (ref 31.5–35.7)
MCV: 91 fL (ref 79–97)
PLATELETS: 336 10*3/uL (ref 150–379)
RBC: 4.32 x10E6/uL (ref 3.77–5.28)
RDW: 13.1 % (ref 12.3–15.4)
WBC: 7.5 10*3/uL (ref 3.4–10.8)

## 2017-02-19 LAB — COMPREHENSIVE METABOLIC PANEL
A/G RATIO: 1.9 (ref 1.2–2.2)
ALT: 20 IU/L (ref 0–32)
AST: 16 IU/L (ref 0–40)
Albumin: 4.8 g/dL (ref 3.5–5.5)
Alkaline Phosphatase: 45 IU/L (ref 39–117)
BUN/Creatinine Ratio: 15 (ref 9–23)
BUN: 11 mg/dL (ref 6–20)
Bilirubin Total: 0.5 mg/dL (ref 0.0–1.2)
CALCIUM: 9.3 mg/dL (ref 8.7–10.2)
CO2: 23 mmol/L (ref 18–29)
Chloride: 103 mmol/L (ref 96–106)
Creatinine, Ser: 0.75 mg/dL (ref 0.57–1.00)
GFR, EST AFRICAN AMERICAN: 122 mL/min/{1.73_m2} (ref >59)
GFR, EST NON AFRICAN AMERICAN: 106 mL/min/{1.73_m2} (ref >59)
GLUCOSE: 86 mg/dL (ref 65–99)
Globulin, Total: 2.5 g/dL (ref 1.5–4.5)
Potassium: 4.1 mmol/L (ref 3.5–5.2)
Sodium: 138 mmol/L (ref 134–144)
TOTAL PROTEIN: 7.3 g/dL (ref 6.0–8.5)

## 2017-02-19 LAB — CYTOLOGY - PAP
Diagnosis: NEGATIVE
HPV (WINDOPATH): NOT DETECTED

## 2017-02-19 LAB — LIPID PANEL
CHOL/HDL RATIO: 2.2 ratio (ref 0.0–4.4)
Cholesterol, Total: 165 mg/dL (ref 100–199)
HDL: 74 mg/dL (ref >39)
LDL Calculated: 76 mg/dL (ref 0–99)
Triglycerides: 75 mg/dL (ref 0–149)
VLDL CHOLESTEROL CAL: 15 mg/dL (ref 5–40)

## 2017-02-19 LAB — TSH: TSH: 1.71 u[IU]/mL (ref 0.450–4.500)

## 2017-03-18 ENCOUNTER — Encounter: Payer: Self-pay | Admitting: Internal Medicine

## 2017-03-18 ENCOUNTER — Ambulatory Visit (INDEPENDENT_AMBULATORY_CARE_PROVIDER_SITE_OTHER): Payer: BC Managed Care – PPO | Admitting: Internal Medicine

## 2017-03-18 DIAGNOSIS — D6800 Von Willebrand disease, unspecified: Secondary | ICD-10-CM

## 2017-03-18 DIAGNOSIS — C439 Malignant melanoma of skin, unspecified: Secondary | ICD-10-CM | POA: Diagnosis not present

## 2017-03-18 MED ORDER — DESMOPRESSIN ACETATE 1.5 MG/ML NA SOLN: 1.0000 | Freq: Every day | NASAL | 0 refills | Status: DC

## 2017-03-18 NOTE — Progress Notes (Signed)
HPI  Pt presents to clinic today to establish care and for management of the conditions listed below. She  moved from [place] 5 years ago, but has not established care with a PCP.  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 She is scheduled to have more excised in 2 weeks.  Von Willebrand's Disease: She denies s/s of bleeding at this time. She does need a RX for Stimate as she will be having further excision of her left arm melanoma.  Flu: never Tetanus: ?2013 Pap Smear: 02/18/17, Jeannetta Nap, NP. Dentist: biannually  Past Medical History:  Diagnosis Date  . Von Willebrand disease ([place])     Current Outpatient Prescriptions  Medication Sig Dispense Refill  . ibuprofen (ADVIL,MOTRIN) 800 MG tablet Take 800 mg by mouth every 8 (eight) hours as needed.    . Multiple Vitamin (MULTIVITAMIN) tablet Take 1 tablet by mouth daily.     No current facility-administered medications for this visit.     Allergies  Allergen Reactions  . Codeine Shortness Of Breath    Family History  Problem Relation Age of Onset  . Cancer Maternal Grandfather        oral  . Breast cancer Paternal Aunt 36    Social History   Social History  . Marital status: Married    Spouse name: N/A  . Number of children: N/A  . Years of education: N/A   Occupational History  . 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 Smoking status: Never Smoker  . Smokeless tobacco: Never Used  . Alcohol use Yes     Comment: occ.  . Drug use: No  . Sexual activity: Yes    Partners: Male   Other Topics Concern  . Not on file   Social History Narrative  . No narrative on file    ROS:  Constitutional: Denies fever, malaise, fatigue, headache or abrupt weight changes.  HEENT: Denies eye pain, eye redness, ear pain, ringing in the ears, wax buildup, runny nose, nasal congestion, bloody nose, or sore throat. Respiratory: Denies difficulty breathing, shortness of breath, cough or sputum  production.   Cardiovascular: Denies chest pain, chest tightness, palpitations or swelling in the hands or feet.  Gastrointestinal: Denies abdominal pain, bloating, constipation, diarrhea or blood in the stool.  GU: Denies frequency, urgency, pain with urination, blood in urine, odor or discharge. Musculoskeletal: Denies decrease in range of motion, difficulty with gait, muscle pain or joint pain and swelling.  Skin: Pt reports laceration to left elbow. Denies redness, rashes, lesions or ulcercations.  Neurological: Denies dizziness, difficulty with memory, difficulty with speech or problems with balance and coordination.  Psych: Denies anxiety, depression, SI/HI.  No other specific complaints in a complete review of systems (except as listed in HPI above).  PE:  BP 108/72   Pulse 82   Temp 98.2 F (36.8 C) (Oral)   Ht 5' 1.5" (1.562 m)   Wt 105 lb (47.6 kg)   LMP 02/24/2017   SpO2 99%   BMI 19.52 kg/m   Wt Readings from Last 3 Encounters:  03/18/17 105 lb (47.6 kg)  02/18/17 100 lb (45.4 kg)  12/20/14 107 lb (48.5 kg)    General: Appears her stated age, in NAD. Skin: Scabbed biopsy site, left lateral forearm. Cardiovascular: Normal rate and rhythm.  Pulmonary/Chest: Normal effort and positive vesicular breath sounds. No respiratory distress. No wheezes, rales or ronchi noted.  Neurological: Alert and oriented. Psychiatric: Mood and  affect normal. Behavior is normal. Judgment and thought content normal.   BMET    Component Value Date/Time   NA 138 02/18/2017 0840   K 4.1 02/18/2017 0840   CL 103 02/18/2017 0840   CO2 23 02/18/2017 0840   GLUCOSE 86 02/18/2017 0840   GLUCOSE 74 08/16/2013 1538   BUN 11 02/18/2017 0840   CREATININE 0.75 02/18/2017 0840   CREATININE 0.72 08/16/2013 1538   CALCIUM 9.3 02/18/2017 0840   GFRNONAA 106 02/18/2017 0840   GFRAA 122 02/18/2017 0840    Lipid Panel     Component Value Date/Time   CHOL 165 02/18/2017 0840   TRIG 75  02/18/2017 0840   HDL 74 02/18/2017 0840   CHOLHDL 2.2 02/18/2017 0840   CHOLHDL 2.6 08/16/2013 1538   VLDL 20 08/16/2013 1538   LDLCALC 76 02/18/2017 0840    CBC    Component Value Date/Time   WBC 7.5 02/18/2017 0840   WBC 14.1 (H) 08/16/2013 1538   RBC 4.32 02/18/2017 0840   RBC 4.15 08/16/2013 1538   HGB 13.0 02/18/2017 0840   HCT 39.3 02/18/2017 0840   PLT 336 02/18/2017 0840   MCV 91 02/18/2017 0840   MCH 30.1 02/18/2017 0840   MCH 30.4 08/16/2013 1538   MCHC 33.1 02/18/2017 0840   MCHC 33.8 08/16/2013 1538   RDW 13.1 02/18/2017 0840    Hgb A1C No results found for: HGBA1C   Assessment and Plan:  Make an appt for your annual exam Webb Silversmith, NP

## 2017-03-18 NOTE — Patient Instructions (Signed)
Melanoma Melanoma is a form of skin cancer that begins in melanocytes. Melanocytes are the skin cells that produce pigment. Melanoma starts as a mole on the skin and can spread to other parts of the body. If found early, many cases of melanoma are curable. What are the causes? The exact cause is unknown. What increases the risk?  Spending a lot of time in the sun, under a sunlamp, or in a tanning booth.  Having sunburn that blisters. The more blistering sunburns you have, the higher your risk of melanoma.  Spending time in parts of the world with more intense sunlight.  Living in a hot, sunny climate.  Having fair skin that does not tan easily.  Having had melanoma before.  Having a family history of melanoma.  Having more than 100 skin moles. What are the signs or symptoms?  ABCDE changes in a mole. ABCDE stands for: ? Asymmetry. This means the mole has an irregular shape. It is not round or oval. ? Border. This means the mole has an irregular or bumpy border. ? Color. This means the mole has multiple colors in it, including brown, black, blue, red, or tan. ? Diameter. This means the mole is more than 0.2 in (6 mm) across. ? Evolving. This refers to any unusual changes or symptoms in the mole, such as pain, itching, stinging, sensitivity, or bleeding.  A new mole.  Swollen lymph nodes.  Shortness of breath.  Bone pain.  Headache.  Seizures.  Visual problems. How is this diagnosed? Your health care provider will take a tissue sample from the mole to examine under a microscope (biopsy). The biopsy will reveal whether melanoma has spread to deeper layers of the skin. Your health care provider will also order tests, including:  Blood tests.  Chest X-rays.  A CT scan.  A bone scan.  How is this treated? You will have surgery to remove the cancer. Your lymph nodes may also be removed during the surgery. If melanoma has spread to other organs, such as the liver,  lungs, bone, or brain, you will need additional treatment. How is this prevented? Melanoma may come back (recur) after it has been treated. Your risk of recurrence is higher if you had thick or ulcerated tumors or patches of tumors. Generally, the more advanced your melanoma, the more likely it will recur. You can help prevent melanoma from recurring by staying out of the sun, especially during peak midafternoon hours. When you are outdoors or in the sun:  Wear long sleeves, a hat, and sunglasses that block UV light when possible.  Apply a sunscreen with an SPF of 30 or higher regularly.  Take these precautions on cloudy days and in the winter, even if you will be outdoors for only a short period of time. Contact a health care provider if:  You notice any new growths or changes in your skin.  You have had melanoma removed and you notice a new growth in the same location. This information is not intended to replace advice given to you by your health care provider. Make sure you discuss any questions you have with your health care provider. Document Released: 09/09/2005 Document Revised: 02/15/2016 Document Reviewed: 11/03/2013 Elsevier Interactive Patient Education  2017 Elsevier Inc.  

## 2017-03-18 NOTE — Assessment & Plan Note (Signed)
eRx for Stimate to use during excision to help control bleeding

## 2017-03-18 NOTE — Assessment & Plan Note (Signed)
Currently following with dermatology

## 2017-03-24 ENCOUNTER — Telehealth: Payer: Self-pay

## 2017-03-24 NOTE — Telephone Encounter (Signed)
Pt left v/m; pt got a call that PA Stimate was denied by ins and pt request call to [place] [phone].pt request cb.

## 2017-03-24 NOTE — Telephone Encounter (Signed)
Received response via fax.... Medication was denied because there was not a specific type of Von willebrand disease listed... I have called pt and Left detailed msg on VM per HIPAA.... Please advise if alternative is needed

## 2017-03-24 NOTE — Telephone Encounter (Signed)
I have not received fax from pharmacy. I have submitted PA via [email or web address] awaiting response

## 2017-03-24 NOTE — Telephone Encounter (Signed)
Pt said [facility] advised pt PA needed for stimate; [facility]; pt request ASAP due to procedure on 03/27/17. Pt request cb when completed.

## 2017-03-25 NOTE — Telephone Encounter (Signed)
Pt left v/m requesting cb with update on PA for Stimate.

## 2017-03-25 NOTE — Telephone Encounter (Signed)
Left detailed msg on VM per HIPAA Left number for pt to call request an urgent override 1 time to cover at local pharmacy

## 2017-03-25 NOTE — Telephone Encounter (Signed)
Prior Laura [name] was resubmitted adding type 1 to Dx

## 2017-03-25 NOTE — Telephone Encounter (Signed)
I called [facility] and they told me that the Rx was approved but can not be filled through local pharmacy, just [facility]....   [facility] at [phone] and they told me that the pt's insurance requires Rx to be filled through them. Pt will need to call the number listed above and request for a 1 time override to fill at local pharmacy as this med is needed urgently.   I called pt with no answer, did not leave msg as earlier when I spoke to pt she stated she is unable to retrieve VM.... Will try again.Marland KitchenMarland Kitchen

## 2017-03-27 NOTE — Telephone Encounter (Signed)
Laura [name] with [facility] left v/m requesting cb with question on directions of Stimate; notes one spray into the nose daily; wants to know if the one spray into the nose or one spray in each nostril and wants to ck the frequency also. Laura [name] request cb.

## 2017-03-27 NOTE — Telephone Encounter (Signed)
Esther with [facility] left v/m;Need stimate rx ASAP for pt to have for a procedure. I spoke with pt and was advised that [facility] had [facility] her and could transfer stimate rx from [place]. I spoke with Sherlynn Stalls and she said yes she is going to call [facility] to get stimate rx and will cb if needed but at this time nothing further needed. FYI to [place].

## 2017-03-31 NOTE — Telephone Encounter (Signed)
Left message on voicemail.

## 2017-03-31 NOTE — Telephone Encounter (Signed)
1 spray each nostril daily.

## 2017-04-01 NOTE — Telephone Encounter (Signed)
Esther with [facility] called. She stated that [facility] sent out RX with original instructions sent [facility] from [facility] stating 1 spray in nose daily, instead of 1 spray into nostril daily. Says she received message with instruction to spray in each nostril after rx was sent out. Requests callback at [phone].

## 2017-12-04 ENCOUNTER — Ambulatory Visit: Payer: BC Managed Care – PPO | Admitting: Internal Medicine

## 2017-12-04 ENCOUNTER — Encounter: Payer: Self-pay | Admitting: Internal Medicine

## 2017-12-04 VITALS — BP 106/68 | HR 97 | Temp 98.7°F | Wt 106.0 lb

## 2017-12-04 DIAGNOSIS — R195 Other fecal abnormalities: Secondary | ICD-10-CM | POA: Diagnosis not present

## 2017-12-04 DIAGNOSIS — R509 Fever, unspecified: Secondary | ICD-10-CM | POA: Diagnosis not present

## 2017-12-04 NOTE — Patient Instructions (Signed)

## 2017-12-04 NOTE — Progress Notes (Signed)
Subjective:    Patient ID: Laura [name], female    DOB: 07/02/1986, 32 y.o.   MRN: [id number]  HPI  Pt presents to the clinic today with c/o loose stools. She reports this stated 1 week ago. She denies abdominal pain, nausea, vomiting or blood in her stool. She has run fevers up to 100.0, and had some chills, but denies body aches. She has tried [employer] and Ibuprofen with some relief. She denies recent changes in diet or medications. She has had sick contacts.  Review of Systems      Past Medical History:  Diagnosis Date  . History of chicken pox   . Melanoma ([place])    left upper arm  . Von Willebrand disease ([place])     Current Outpatient Medications  Medication Sig Dispense Refill  . desmopressin (STIMATE) 1.5 MG/ML SOLN Place 1 spray (0.15 mg total) into the nose daily. 1 Bottle 0  . ibuprofen (ADVIL,MOTRIN) 800 MG tablet Take 800 mg by mouth every 8 (eight) hours as needed.    . Multiple Vitamin (MULTIVITAMIN) tablet Take 1 tablet by mouth daily.     No current facility-administered medications for this visit.     Allergies  Allergen Reactions  . Codeine Shortness Of Breath    Family History  Problem Relation Age of Onset  . Cancer Maternal Grandfather        oral  . Heart disease Maternal Grandfather   . Alcohol abuse Maternal Grandfather   . Breast cancer Paternal Aunt 31  . Depression Mother   . Anxiety disorder Mother   . Heart disease Paternal Grandfather     Social History   Socioeconomic History  . Marital status: Married    Spouse name: Not on file  . Number of children: Not on file  . Years of education: Not on file  . Highest education level: Not on file  Social Needs  . Financial resource strain: Not on file  . Food insecurity - worry: Not on file  . Food insecurity - inability: Not on file  . Transportation needs - medical: Not on file  . Transportation needs - non-medical: Not on file  Occupational History  . Occupation: Product manager: [employer]  Tobacco Use  . Smoking status: Never Smoker  . Smokeless tobacco: Never Used  Substance and Sexual Activity  . Alcohol use: Yes    Comment: occ.  . Drug use: No  . Sexual activity: Yes    Partners: Male  Other Topics Concern  . Not on file  Social History Narrative  . Not on file     Constitutional: Pt reports fever. Denies malaise, fatigue, headache or abrupt weight changes.  HEENT: Denies eye pain, eye redness, ear pain, ringing in the ears, wax buildup, runny nose, nasal congestion, bloody nose, or sore throat. Respiratory: Denies difficulty breathing, shortness of breath, cough or sputum production.   Cardiovascular: Denies chest pain, chest tightness, palpitations or swelling in the hands or feet.  Gastrointestinal: Pt reports loose stools. Denies abdominal pain, bloating, constipation, or blood in the stool.   No other specific complaints in a complete review of systems (except as listed in HPI above).  Objective:   Physical Exam   BP 106/68   Pulse 97   Temp 98.7 F (37.1 C) (Oral)   Wt 106 lb (48.1 kg)   LMP 11/25/2017   SpO2 99%   BMI 19.70 kg/m  Wt Readings from Last 3 Encounters:  12/04/17 106 lb (48.1 kg)  03/18/17 105 lb (47.6 kg)  02/18/17 100 lb (45.4 kg)    General: Appears her stated age, in NAD. Cardiovascular: Normal rate and rhythm. Pulmonary/Chest: Normal effort and positive vesicular breath sounds. No respiratory distress. No wheezes, rales or ronchi noted.  Abdomen: Soft and nontender. Normal bowel sounds. No distention or masses noted.    BMET    Component Value Date/Time   NA 138 02/18/2017 0840   K 4.1 02/18/2017 0840   CL 103 02/18/2017 0840   CO2 23 02/18/2017 0840   GLUCOSE 86 02/18/2017 0840   GLUCOSE 74 08/16/2013 1538   BUN 11 02/18/2017 0840   CREATININE 0.75 02/18/2017 0840   CREATININE 0.72 08/16/2013 1538   CALCIUM 9.3 02/18/2017 0840   GFRNONAA 106 02/18/2017 0840   GFRAA 122 02/18/2017  0840    Lipid Panel     Component Value Date/Time   CHOL 165 02/18/2017 0840   TRIG 75 02/18/2017 0840   HDL 74 02/18/2017 0840   CHOLHDL 2.2 02/18/2017 0840   CHOLHDL 2.6 08/16/2013 1538   VLDL 20 08/16/2013 1538   LDLCALC 76 02/18/2017 0840    CBC    Component Value Date/Time   WBC 7.5 02/18/2017 0840   WBC 14.1 (H) 08/16/2013 1538   RBC 4.32 02/18/2017 0840   RBC 4.15 08/16/2013 1538   HGB 13.0 02/18/2017 0840   HCT 39.3 02/18/2017 0840   PLT 336 02/18/2017 0840   MCV 91 02/18/2017 0840   MCH 30.1 02/18/2017 0840   MCH 30.4 08/16/2013 1538   MCHC 33.1 02/18/2017 0840   MCHC 33.8 08/16/2013 1538   RDW 13.1 02/18/2017 0840    Hgb A1C No results found for: HGBA1C         Assessment & Plan:   Fever, Loose Stools:  She has not had any symptoms in 2 days Likely viral Discussed bland diet, advance as tolerated Discussed the importance of good handwashing Will monitor symptoms for now  Return precautions discussed Webb Silversmith, NP

## 2017-12-09 ENCOUNTER — Encounter: Payer: Self-pay | Admitting: Radiology

## 2018-04-14 ENCOUNTER — Ambulatory Visit: Payer: BC Managed Care – PPO | Admitting: Internal Medicine

## 2018-04-27 ENCOUNTER — Ambulatory Visit: Payer: BC Managed Care – PPO | Admitting: Internal Medicine

## 2018-04-27 ENCOUNTER — Encounter: Payer: Self-pay | Admitting: Internal Medicine

## 2018-04-27 VITALS — BP 104/74 | HR 90 | Temp 98.4°F | Wt 105.0 lb

## 2018-04-27 DIAGNOSIS — F411 Generalized anxiety disorder: Secondary | ICD-10-CM

## 2018-04-27 MED ORDER — ESCITALOPRAM OXALATE 10 MG PO TABS: 10.0000 mg | ORAL_TABLET | Freq: Every day | ORAL | 2 refills | Status: DC

## 2018-04-27 NOTE — Progress Notes (Signed)
Subjective:    Patient ID: Laura [name], female    DOB: [birth year], 32 y.o.   MRN: [id number]  HPI  Pt presents to the clinic today to discuss anxiety. She feels like she has always been an anxious person, but feels like her anxiety is getting worse. She reports daily anxiety, but has periods where it seems worse at least 2-3 times per week. She reports palpitations, sweaty palms, nausea, loss of appetite and loose stools. She is not sure what triggers this but feels like a change in her routine during the summer may have something to do with it. She denies depression, SI/HI.  Review of Systems      Past Medical History:  Diagnosis Date  . History of chicken pox   . Melanoma ([place])    left upper arm  . Von Willebrand disease ([place])     Current Outpatient Medications  Medication Sig Dispense Refill  . desmopressin (STIMATE) 1.5 MG/ML SOLN Place 1 spray (0.15 mg total) into the nose daily. 1 Bottle 0  . ibuprofen (ADVIL,MOTRIN) 800 MG tablet Take 800 mg by mouth every 8 (eight) hours as needed.    . Multiple Vitamin (MULTIVITAMIN) tablet Take 1 tablet by mouth daily.    Marland Kitchen escitalopram (LEXAPRO) 10 MG tablet Take 1 tablet (10 mg total) by mouth daily. 30 tablet 2   No current facility-administered medications for this visit.     Allergies  Allergen Reactions  . Codeine Shortness Of Breath    Family History  Problem Relation Age of Onset  . Cancer Maternal Grandfather        oral  . Heart disease Maternal Grandfather   . Alcohol abuse Maternal Grandfather   . Breast cancer Paternal Aunt 79  . Depression Mother   . Anxiety disorder Mother   . Heart disease Paternal Grandfather     Social History   Socioeconomic History  . Marital status: Married    Spouse name: Not on file  . Number of children: Not on file  . Years of education: Not on file  . Highest education level: Not on file  Occupational History  . Occupation: Product manager: [employer]  . Financial resource strain: Not on file  . Food insecurity:    Worry: Not on file    Inability: Not on file  . Transportation needs:    Medical: Not on file    Non-medical: Not on file  Tobacco Use  . Smoking status: Never Smoker  . Smokeless tobacco: Never Used  Substance and Sexual Activity  . Alcohol use: Yes    Comment: occ.  . Drug use: No  . Sexual activity: Yes    Partners: Male  Lifestyle  . Physical activity:    Days per week: Not on file    Minutes per session: Not on file  . Stress: Not on file  Relationships  . Social connections:    Talks on phone: Not on file    Gets together: Not on file    Attends religious service: Not on file    Active member of club or organization: Not on file    Attends meetings of clubs or organizations: Not on file    Relationship status: Not on file  . Intimate partner violence:    Fear of current or ex partner: Not on file    Emotionally abused: Not on file    Physically abused: Not on file  Forced sexual activity: Not on file  Other Topics Concern  . Not on file  Social History Narrative  . Not on file     Constitutional: Denies fever, malaise, fatigue, headache or abrupt weight changes.  Respiratory: Denies difficulty breathing, shortness of breath, cough or sputum production.   Cardiovascular: Denies chest pain, chest tightness, palpitations or swelling in the hands or feet.  Gastrointestinal: Denies abdominal pain, bloating, constipation, diarrhea or blood in the stool.  Skin: Denies redness, rashes, lesions or ulcercations.  Neurological: Denies dizziness, difficulty with memory, difficulty with speech or problems with balance and coordination.  Psych: Pt reports anxiety. Denies depression, SI/HI.  No other specific complaints in a complete review of systems (except as listed in HPI above).  Objective:   Physical Exam  BP 104/74   Pulse 90   Temp 98.4 F (36.9 C) (Oral)   Wt 105 lb (47.6 kg)    LMP 03/30/2018   SpO2 98%   BMI 19.52 kg/m  Wt Readings from Last 3 Encounters:  04/27/18 105 lb (47.6 kg)  12/04/17 106 lb (48.1 kg)  03/18/17 105 lb (47.6 kg)    General: Appears her stated age, well developed, well nourished in NAD. Cardiovascular: Normal rate and rhythm. S1,S2 noted.  No murmur, rubs or gallops noted.  Pulmonary/Chest: Normal effort and positive vesicular breath sounds. No respiratory distress. No wheezes, rales or ronchi noted.  Neurological: Alert and oriented.  Psychiatric: Mood and affect normal. Behavior is normal. Judgment and thought content normal.     BMET    Component Value Date/Time   NA 138 02/18/2017 0840   K 4.1 02/18/2017 0840   CL 103 02/18/2017 0840   CO2 23 02/18/2017 0840   GLUCOSE 86 02/18/2017 0840   GLUCOSE 74 08/16/2013 1538   BUN 11 02/18/2017 0840   CREATININE 0.75 02/18/2017 0840   CREATININE 0.72 08/16/2013 1538   CALCIUM 9.3 02/18/2017 0840   GFRNONAA 106 02/18/2017 0840   GFRAA 122 02/18/2017 0840    Lipid Panel     Component Value Date/Time   CHOL 165 02/18/2017 0840   TRIG 75 02/18/2017 0840   HDL 74 02/18/2017 0840   CHOLHDL 2.2 02/18/2017 0840   CHOLHDL 2.6 08/16/2013 1538   VLDL 20 08/16/2013 1538   LDLCALC 76 02/18/2017 0840    CBC    Component Value Date/Time   WBC 7.5 02/18/2017 0840   WBC 14.1 (H) 08/16/2013 1538   RBC 4.32 02/18/2017 0840   RBC 4.15 08/16/2013 1538   HGB 13.0 02/18/2017 0840   HCT 39.3 02/18/2017 0840   PLT 336 02/18/2017 0840   MCV 91 02/18/2017 0840   MCH 30.1 02/18/2017 0840   MCH 30.4 08/16/2013 1538   MCHC 33.1 02/18/2017 0840   MCHC 33.8 08/16/2013 1538   RDW 13.1 02/18/2017 0840    Hgb A1C No results found for: HGBA1C          Assessment & Plan:

## 2018-04-27 NOTE — Patient Instructions (Signed)

## 2018-04-27 NOTE — Assessment & Plan Note (Signed)
Support offered today Discussed treatment options including medication, therapy Will trial Lexapro 10 mg daily (take 1/2 tab daily for 2 weeks, if feeling fine then increase to 1 tab daily)  Update me in 4 weeks via mychart, sooner if needed

## 2018-05-19 ENCOUNTER — Other Ambulatory Visit: Payer: Self-pay | Admitting: Internal Medicine

## 2018-07-24 ENCOUNTER — Other Ambulatory Visit: Payer: Self-pay | Admitting: Internal Medicine

## 2018-08-21 ENCOUNTER — Other Ambulatory Visit: Payer: Self-pay | Admitting: Internal Medicine

## 2018-10-03 ENCOUNTER — Telehealth: Payer: BC Managed Care – PPO | Admitting: Nurse Practitioner

## 2018-10-03 DIAGNOSIS — J029 Acute pharyngitis, unspecified: Secondary | ICD-10-CM | POA: Diagnosis not present

## 2018-10-03 NOTE — Progress Notes (Signed)

## 2018-10-20 ENCOUNTER — Other Ambulatory Visit: Payer: Self-pay | Admitting: Internal Medicine

## 2019-01-17 ENCOUNTER — Other Ambulatory Visit: Payer: Self-pay | Admitting: Internal Medicine

## 2019-03-01 ENCOUNTER — Other Ambulatory Visit: Payer: Self-pay | Admitting: Internal Medicine

## 2019-03-02 MED ORDER — ESCITALOPRAM OXALATE 10 MG PO TABS: 10.0000 mg | ORAL_TABLET | Freq: Every day | ORAL | 0 refills | Status: DC

## 2019-03-24 ENCOUNTER — Other Ambulatory Visit: Payer: Self-pay | Admitting: Internal Medicine

## 2019-04-08 ENCOUNTER — Encounter: Payer: Self-pay | Admitting: Internal Medicine

## 2019-04-21 ENCOUNTER — Other Ambulatory Visit: Payer: Self-pay | Admitting: Internal Medicine

## 2019-05-03 ENCOUNTER — Ambulatory Visit (INDEPENDENT_AMBULATORY_CARE_PROVIDER_SITE_OTHER): Payer: BC Managed Care – PPO | Admitting: Internal Medicine

## 2019-05-03 ENCOUNTER — Encounter: Payer: Self-pay | Admitting: Internal Medicine

## 2019-05-03 ENCOUNTER — Other Ambulatory Visit: Payer: Self-pay

## 2019-05-03 VITALS — BP 100/70 | HR 83 | Temp 98.3°F | Ht 61.5 in | Wt 117.8 lb

## 2019-05-03 DIAGNOSIS — F411 Generalized anxiety disorder: Secondary | ICD-10-CM

## 2019-05-03 DIAGNOSIS — Z0000 Encounter for general adult medical examination without abnormal findings: Secondary | ICD-10-CM

## 2019-05-03 DIAGNOSIS — D6800 Von Willebrand disease, unspecified: Secondary | ICD-10-CM

## 2019-05-03 DIAGNOSIS — C439 Malignant melanoma of skin, unspecified: Secondary | ICD-10-CM | POA: Diagnosis not present

## 2019-05-03 LAB — COMPREHENSIVE METABOLIC PANEL
ALT: 10 U/L (ref 0–35)
AST: 12 U/L (ref 0–37)
Albumin: 4.8 g/dL (ref 3.5–5.2)
Alkaline Phosphatase: 45 U/L (ref 39–117)
BUN: 9 mg/dL (ref 6–23)
CO2: 28 mEq/L (ref 19–32)
Calcium: 9.6 mg/dL (ref 8.4–10.5)
Chloride: 102 mEq/L (ref 96–112)
Creatinine, Ser: 0.71 mg/dL (ref 0.40–1.20)
GFR: 93.96 mL/min (ref >60.00)
Glucose, Bld: 83 mg/dL (ref 70–99)
Potassium: 4 mEq/L (ref 3.5–5.1)
Sodium: 137 mEq/L (ref 135–145)
Total Bilirubin: 0.5 mg/dL (ref 0.2–1.2)
Total Protein: 7.3 g/dL (ref 6.0–8.3)

## 2019-05-03 LAB — LIPID PANEL
Cholesterol: 186 mg/dL (ref 0–200)
HDL: 68.5 mg/dL (ref >39.00)
LDL Cholesterol: 101 mg/dL — ABNORMAL HIGH (ref 0–99)
NonHDL: 117.53
Total CHOL/HDL Ratio: 3
Triglycerides: 83 mg/dL (ref 0.0–149.0)
VLDL: 16.6 mg/dL (ref 0.0–40.0)

## 2019-05-03 LAB — CBC
HCT: 37.2 % (ref 36.0–46.0)
Hemoglobin: 12.4 g/dL (ref 12.0–15.0)
MCHC: 33.4 g/dL (ref 30.0–36.0)
MCV: 93.1 fl (ref 78.0–100.0)
Platelets: 344 10*3/uL (ref 150.0–400.0)
RBC: 3.99 Mil/uL (ref 3.87–5.11)
RDW: 13 % (ref 11.5–15.5)
WBC: 5.4 10*3/uL (ref 4.0–10.5)

## 2019-05-03 NOTE — Assessment & Plan Note (Signed)
Continue to follow with derm

## 2019-05-03 NOTE — Assessment & Plan Note (Signed)
CBC and CMET today She is not interested in seeing a therapist at this time Will monitor

## 2019-05-03 NOTE — Progress Notes (Addendum)
Subjective:    Patient ID: Laura [name], female    DOB: [birth year], 33 y.o.   MRN: [id number]  HPI  Pt presents to the clinic today for her annual exam. She is also due to follow up chronic conditions.  GAD: She is not sure what triggers this. Her anxiety has improved since she started taking Lexapro. She is not currently seeing a therapist. She denies SI/HI.  Von Willebrand's Disease: She denies s/s of bleeding. She is not currently following with hematology.  Hx of Melanoma: Excised by [place] [facility]. She follows with them biannually for a skin check.  Flu: 06/2018 Tetanus: 07/27/2012 Pap Smear: 01/2017 Dentist: biannually  Diet: She does eat meat. She consumes fruits and veggies daily. She does eat some fried foods. She drinks mostly water, coffee. Exercise: Waking dogs  Review of Systems      Past Medical History:  Diagnosis Date  . History of chicken pox   . Melanoma ([place])    left upper arm  . Von Willebrand disease ([place])     Current Outpatient Medications  Medication Sig Dispense Refill  . desmopressin (STIMATE) 1.5 MG/ML SOLN Place 1 spray (0.15 mg total) into the nose daily. 1 Bottle 0  . escitalopram (LEXAPRO) 10 MG tablet Take 1 tablet (10 mg total) by mouth daily. 30 tablet 0  . ibuprofen (ADVIL,MOTRIN) 800 MG tablet Take 800 mg by mouth every 8 (eight) hours as needed.    . Multiple Vitamin (MULTIVITAMIN) tablet Take 1 tablet by mouth daily.     No current facility-administered medications for this visit.     Allergies  Allergen Reactions  . Codeine Shortness Of Breath    Family History  Problem Relation Age of Onset  . Cancer Maternal Grandfather        oral  . Heart disease Maternal Grandfather   . Alcohol abuse Maternal Grandfather   . Breast cancer Paternal Aunt 25  . Depression Mother   . Anxiety disorder Mother   . Heart disease Paternal Grandfather     Social History   Socioeconomic History  . Marital status: Married    Spouse name:  Not on file  . Number of children: Not on file  . Years of education: Not on file  . Highest education level: Not on file  Occupational History  . Occupation: Product manager: [employer]  . Financial resource strain: Not on file  . Food insecurity    Worry: Not on file    Inability: Not on file  . Transportation needs    Medical: Not on file    Non-medical: Not on file  Tobacco Use  . Smoking status: Never Smoker  . Smokeless tobacco: Never Used  Substance and Sexual Activity  . Alcohol use: Yes    Comment: occ.  . Drug use: No  . Sexual activity: Yes    Partners: Male  Lifestyle  . Physical activity    Days per week: Not on file    Minutes per session: Not on file  . Stress: Not on file  Relationships  . Social Herbalist on phone: Not on file    Gets together: Not on file    Attends religious service: Not on file    Active member of club or organization: Not on file    Attends meetings of clubs or organizations: Not on file    Relationship status: Not on file  . Intimate partner  violence    Fear of current or ex partner: Not on file    Emotionally abused: Not on file    Physically abused: Not on file    Forced sexual activity: Not on file  Other Topics Concern  . Not on file  Social History Narrative  . Not on file     Constitutional: Denies fever, malaise, fatigue, headache or abrupt weight changes.  HEENT: Denies eye pain, eye redness, ear pain, ringing in the ears, wax buildup, runny nose, nasal congestion, bloody nose, or sore throat. Respiratory: Denies difficulty breathing, shortness of breath, cough or sputum production.   Cardiovascular: Denies chest pain, chest tightness, palpitations or swelling in the hands or feet.  Gastrointestinal: Pt reports intermittent heartburn. Denies abdominal pain, bloating, constipation, diarrhea or blood in the stool.  GU: Denies urgency, frequency, pain with urination, burning sensation,  blood in urine, odor or discharge. Musculoskeletal: Denies decrease in range of motion, difficulty with gait, muscle pain or joint pain and swelling.  Skin: Denies redness, rashes, lesions or ulcercations.  Neurological: Denies dizziness, difficulty with memory, difficulty with speech or problems with balance and coordination.  Psych: Pt has a history of anxiety. Denies depression, SI/HI.   No other specific complaints in a complete review of systems (except as listed in HPI above).  Objective:   Physical Exam   BP 100/70   Pulse 83   Temp 98.3 F (36.8 C) (Temporal)   Ht 5' 1.5" (1.562 m)   Wt 117 lb 12 oz (53.4 kg)   LMP 04/26/2019   SpO2 98%   BMI 21.89 kg/m  Wt Readings from Last 3 Encounters:  05/03/19 117 lb 12 oz (53.4 kg)  04/27/18 105 lb (47.6 kg)  12/04/17 106 lb (48.1 kg)    General: Appears her stated age, well developed, well nourished in NAD. Skin: Warm, dry and intact. Scar noted over left lateral elbow. HEENT: Head: normal shape and size; Eyes: sclera white, no icterus, conjunctiva pink, PERRLA and EOMs intact; Ears: Tm's gray and intact, normal light reflex; Neck:  Neck supple, trachea midline. No masses, lumps or thyromegaly present.  Cardiovascular: Normal rate and rhythm. S1,S2 noted.  No murmur, rubs or gallops noted. No JVD or BLE edema.  Pulmonary/Chest: Normal effort and positive vesicular breath sounds. No respiratory distress. No wheezes, rales or ronchi noted.  Abdomen: Soft and nontender. Normal bowel sounds. No distention or masses noted. Liver, spleen and kidneys non palpable. Musculoskeletal: Strength 5/5 BUE/BLE. No difficulty with gait.  Neurological: Alert and oriented. Cranial nerves II-XII grossly intact. Coordination normal.  Psychiatric: Mood and affect normal. Behavior is normal. Judgment and thought content normal.    BMET    Component Value Date/Time   NA 138 02/18/2017 0840   K 4.1 02/18/2017 0840   CL 103 02/18/2017 0840   CO2  23 02/18/2017 0840   GLUCOSE 86 02/18/2017 0840   GLUCOSE 74 08/16/2013 1538   BUN 11 02/18/2017 0840   CREATININE 0.75 02/18/2017 0840   CREATININE 0.72 08/16/2013 1538   CALCIUM 9.3 02/18/2017 0840   GFRNONAA 106 02/18/2017 0840   GFRAA 122 02/18/2017 0840    Lipid Panel     Component Value Date/Time   CHOL 165 02/18/2017 0840   TRIG 75 02/18/2017 0840   HDL 74 02/18/2017 0840   CHOLHDL 2.2 02/18/2017 0840   CHOLHDL 2.6 08/16/2013 1538   VLDL 20 08/16/2013 1538   LDLCALC 76 02/18/2017 0840    CBC  Component Value Date/Time   WBC 7.5 02/18/2017 0840   WBC 14.1 (H) 08/16/2013 1538   RBC 4.32 02/18/2017 0840   RBC 4.15 08/16/2013 1538   HGB 13.0 02/18/2017 0840   HCT 39.3 02/18/2017 0840   PLT 336 02/18/2017 0840   MCV 91 02/18/2017 0840   MCH 30.1 02/18/2017 0840   MCH 30.4 08/16/2013 1538   MCHC 33.1 02/18/2017 0840   MCHC 33.8 08/16/2013 1538   RDW 13.1 02/18/2017 0840    Hgb A1C No results found for: HGBA1C         Assessment & Plan:   Preventative Health Maintenance:  Encouraged her to get a flu shot in the fall She declines tetanus booster at this time, she thinks its due in 2023 Pap smear due 2021 Encouraged her to consume a balanced diet and exercise regimen Advised her to see a dentist annually Will check CBC, CMET, Lipid profile today  RTC in 1 year, sooner if needed Webb Silversmith, NP

## 2019-05-03 NOTE — Patient Instructions (Signed)
Health Maintenance, Female Adopting a healthy lifestyle and getting preventive care are important in promoting health and wellness. Ask your health care provider about:  The right schedule for you to have regular tests and exams.  Things you can do on your own to prevent diseases and keep yourself healthy. What should I know about diet, weight, and exercise? Eat a healthy diet   Eat a diet that includes plenty of vegetables, fruits, low-fat dairy products, and lean protein.  Do not eat a lot of foods that are high in solid fats, added sugars, or sodium. Maintain a healthy weight Body mass index (BMI) is used to identify weight problems. It estimates body fat based on height and weight. Your health care provider can help determine your BMI and help you achieve or maintain a healthy weight. Get regular exercise Get regular exercise. This is one of the most important things you can do for your health. Most adults should:  Exercise for at least 150 minutes each week. The exercise should increase your heart rate and make you sweat (moderate-intensity exercise).  Do strengthening exercises at least twice a week. This is in addition to the moderate-intensity exercise.  Spend less time sitting. Even light physical activity can be beneficial. Watch cholesterol and blood lipids Have your blood tested for lipids and cholesterol at 34 years of age, then have this test every 5 years. Have your cholesterol levels checked more often if:  Your lipid or cholesterol levels are high.  You are older than 34 years of age.  You are at high risk for heart disease. What should I know about cancer screening? Depending on your health history and family history, you may need to have cancer screening at various ages. This may include screening for:  Breast cancer.  Cervical cancer.  Colorectal cancer.  Skin cancer.  Lung cancer. What should I know about heart disease, diabetes, and high blood  pressure? Blood pressure and heart disease  High blood pressure causes heart disease and increases the risk of stroke. This is more likely to develop in people who have high blood pressure readings, are of African descent, or are overweight.  Have your blood pressure checked: ? Every 3-5 years if you are 18-39 years of age. ? Every year if you are 40 years old or older. Diabetes Have regular diabetes screenings. This checks your fasting blood sugar level. Have the screening done:  Once every three years after age 40 if you are at a normal weight and have a low risk for diabetes.  More often and at a younger age if you are overweight or have a high risk for diabetes. What should I know about preventing infection? Hepatitis B If you have a higher risk for hepatitis B, you should be screened for this virus. Talk with your health care provider to find out if you are at risk for hepatitis B infection. Hepatitis C Testing is recommended for:  Everyone born from 1945 through 1965.  Anyone with known risk factors for hepatitis C. Sexually transmitted infections (STIs)  Get screened for STIs, including gonorrhea and chlamydia, if: ? You are sexually active and are younger than 34 years of age. ? You are older than 34 years of age and your health care provider tells you that you are at risk for this type of infection. ? Your sexual activity has changed since you were last screened, and you are at increased risk for chlamydia or gonorrhea. Ask your health care provider if   you are at risk.  Ask your health care provider about whether you are at high risk for HIV. Your health care provider may recommend a prescription medicine to help prevent HIV infection. If you choose to take medicine to prevent HIV, you should first get tested for HIV. You should then be tested every 3 months for as long as you are taking the medicine. Pregnancy  If you are about to stop having your period (premenopausal) and  you may become pregnant, seek counseling before you get pregnant.  Take 400 to 800 micrograms (mcg) of folic acid every day if you become pregnant.  Ask for birth control (contraception) if you want to prevent pregnancy. Osteoporosis and menopause Osteoporosis is a disease in which the bones lose minerals and strength with aging. This can result in bone fractures. If you are 65 years old or older, or if you are at risk for osteoporosis and fractures, ask your health care provider if you should:  Be screened for bone loss.  Take a calcium or vitamin D supplement to lower your risk of fractures.  Be given hormone replacement therapy (HRT) to treat symptoms of menopause. Follow these instructions at home: Lifestyle  Do not use any products that contain nicotine or tobacco, such as cigarettes, e-cigarettes, and chewing tobacco. If you need help quitting, ask your health care provider.  Do not use street drugs.  Do not share needles.  Ask your health care provider for help if you need support or information about quitting drugs. Alcohol use  Do not drink alcohol if: ? Your health care provider tells you not to drink. ? You are pregnant, may be pregnant, or are planning to become pregnant.  If you drink alcohol: ? Limit how much you use to 0-1 drink a day. ? Limit intake if you are breastfeeding.  Be aware of how much alcohol is in your drink. In the [place] one drink equals one 12 oz bottle of beer (355 mL), one 5 oz glass of wine (148 mL), or one 1 oz glass of hard liquor (44 mL). General instructions  Schedule regular health, dental, and eye exams.  Stay current with your vaccines.  Tell your health care provider if: ? You often feel depressed. ? You have ever been abused or do not feel safe at home. Summary  Adopting a healthy lifestyle and getting preventive care are important in promoting health and wellness.  Follow your health care provider's instructions about healthy  diet, exercising, and getting tested or screened for diseases.  Follow your health care provider's instructions on monitoring your cholesterol and blood pressure. This information is not intended to replace advice given to you by your health care provider. Make sure you discuss any questions you have with your health care provider. Document Released: 03/25/2011 Document Revised: 09/02/2018 Document Reviewed: 09/02/2018 Elsevier Patient Education  2020 [employer] Inc[employer]  

## 2019-05-03 NOTE — Assessment & Plan Note (Signed)
Well controlled on Lexapro Support offered today Will monitor

## 2019-05-10 ENCOUNTER — Encounter: Payer: BC Managed Care – PPO | Admitting: Internal Medicine

## 2019-05-18 ENCOUNTER — Other Ambulatory Visit: Payer: Self-pay | Admitting: Internal Medicine

## 2019-07-06 ENCOUNTER — Ambulatory Visit: Payer: BC Managed Care – PPO

## 2019-07-06 ENCOUNTER — Ambulatory Visit (INDEPENDENT_AMBULATORY_CARE_PROVIDER_SITE_OTHER): Payer: BC Managed Care – PPO

## 2019-07-06 DIAGNOSIS — Z23 Encounter for immunization: Secondary | ICD-10-CM | POA: Diagnosis not present

## 2020-02-27 ENCOUNTER — Other Ambulatory Visit: Payer: Self-pay | Admitting: Internal Medicine

## 2020-05-22 ENCOUNTER — Other Ambulatory Visit: Payer: Self-pay | Admitting: Internal Medicine

## 2020-06-08 ENCOUNTER — Encounter: Payer: BC Managed Care – PPO | Admitting: Internal Medicine

## 2020-06-12 ENCOUNTER — Ambulatory Visit (INDEPENDENT_AMBULATORY_CARE_PROVIDER_SITE_OTHER): Payer: BC Managed Care – PPO | Admitting: Internal Medicine

## 2020-06-12 ENCOUNTER — Encounter: Payer: Self-pay | Admitting: Internal Medicine

## 2020-06-12 ENCOUNTER — Other Ambulatory Visit: Payer: Self-pay

## 2020-06-12 VITALS — BP 106/70 | HR 81 | Temp 98.1°F | Ht 61.5 in | Wt 128.0 lb

## 2020-06-12 DIAGNOSIS — D6800 Von Willebrand disease, unspecified: Secondary | ICD-10-CM

## 2020-06-12 DIAGNOSIS — F411 Generalized anxiety disorder: Secondary | ICD-10-CM | POA: Diagnosis not present

## 2020-06-12 DIAGNOSIS — C439 Malignant melanoma of skin, unspecified: Secondary | ICD-10-CM | POA: Diagnosis not present

## 2020-06-12 DIAGNOSIS — Z789 Other specified health status: Secondary | ICD-10-CM

## 2020-06-12 DIAGNOSIS — Z0000 Encounter for general adult medical examination without abnormal findings: Secondary | ICD-10-CM

## 2020-06-12 NOTE — Assessment & Plan Note (Signed)
No issues Will monitor

## 2020-06-12 NOTE — Patient Instructions (Signed)
Health Maintenance, Female Adopting a healthy lifestyle and getting preventive care are important in promoting health and wellness. Ask your health care provider about:  The right schedule for you to have regular tests and exams.  Things you can do on your own to prevent diseases and keep yourself healthy. What should I know about diet, weight, and exercise? Eat a healthy diet   Eat a diet that includes plenty of vegetables, fruits, low-fat dairy products, and lean protein.  Do not eat a lot of foods that are high in solid fats, added sugars, or sodium. Maintain a healthy weight Body mass index (BMI) is used to identify weight problems. It estimates body fat based on height and weight. Your health care provider can help determine your BMI and help you achieve or maintain a healthy weight. Get regular exercise Get regular exercise. This is one of the most important things you can do for your health. Most adults should:  Exercise for at least 150 minutes each week. The exercise should increase your heart rate and make you sweat (moderate-intensity exercise).  Do strengthening exercises at least twice a week. This is in addition to the moderate-intensity exercise.  Spend less time sitting. Even light physical activity can be beneficial. Watch cholesterol and blood lipids Have your blood tested for lipids and cholesterol at 35 years of age, then have this test every 5 years. Have your cholesterol levels checked more often if:  Your lipid or cholesterol levels are high.  You are older than 35 years of age.  You are at high risk for heart disease. What should I know about cancer screening? Depending on your health history and family history, you may need to have cancer screening at various ages. This may include screening for:  Breast cancer.  Cervical cancer.  Colorectal cancer.  Skin cancer.  Lung cancer. What should I know about heart disease, diabetes, and high blood  pressure? Blood pressure and heart disease  High blood pressure causes heart disease and increases the risk of stroke. This is more likely to develop in people who have high blood pressure readings, are of African descent, or are overweight.  Have your blood pressure checked: ? Every 3-5 years if you are 18-39 years of age. ? Every year if you are 40 years old or older. Diabetes Have regular diabetes screenings. This checks your fasting blood sugar level. Have the screening done:  Once every three years after age 40 if you are at a normal weight and have a low risk for diabetes.  More often and at a younger age if you are overweight or have a high risk for diabetes. What should I know about preventing infection? Hepatitis B If you have a higher risk for hepatitis B, you should be screened for this virus. Talk with your health care provider to find out if you are at risk for hepatitis B infection. Hepatitis C Testing is recommended for:  Everyone born from 1945 through 1965.  Anyone with known risk factors for hepatitis C. Sexually transmitted infections (STIs)  Get screened for STIs, including gonorrhea and chlamydia, if: ? You are sexually active and are younger than 35 years of age. ? You are older than 35 years of age and your health care provider tells you that you are at risk for this type of infection. ? Your sexual activity has changed since you were last screened, and you are at increased risk for chlamydia or gonorrhea. Ask your health care provider if   you are at risk.  Ask your health care provider about whether you are at high risk for HIV. Your health care provider may recommend a prescription medicine to help prevent HIV infection. If you choose to take medicine to prevent HIV, you should first get tested for HIV. You should then be tested every 3 months for as long as you are taking the medicine. Pregnancy  If you are about to stop having your period (premenopausal) and  you may become pregnant, seek counseling before you get pregnant.  Take 400 to 800 micrograms (mcg) of folic acid every day if you become pregnant.  Ask for birth control (contraception) if you want to prevent pregnancy. Osteoporosis and menopause Osteoporosis is a disease in which the bones lose minerals and strength with aging. This can result in bone fractures. If you are 65 years old or older, or if you are at risk for osteoporosis and fractures, ask your health care provider if you should:  Be screened for bone loss.  Take a calcium or vitamin D supplement to lower your risk of fractures.  Be given hormone replacement therapy (HRT) to treat symptoms of menopause. Follow these instructions at home: Lifestyle  Do not use any products that contain nicotine or tobacco, such as cigarettes, e-cigarettes, and chewing tobacco. If you need help quitting, ask your health care provider.  Do not use street drugs.  Do not share needles.  Ask your health care provider for help if you need support or information about quitting drugs. Alcohol use  Do not drink alcohol if: ? Your health care provider tells you not to drink. ? You are pregnant, may be pregnant, or are planning to become pregnant.  If you drink alcohol: ? Limit how much you use to 0-1 drink a day. ? Limit intake if you are breastfeeding.  Be aware of how much alcohol is in your drink. In the [place] one drink equals one 12 oz bottle of beer (355 mL), one 5 oz glass of wine (148 mL), or one 1 oz glass of hard liquor (44 mL). General instructions  Schedule regular health, dental, and eye exams.  Stay current with your vaccines.  Tell your health care provider if: ? You often feel depressed. ? You have ever been abused or do not feel safe at home. Summary  Adopting a healthy lifestyle and getting preventive care are important in promoting health and wellness.  Follow your health care provider's instructions about healthy  diet, exercising, and getting tested or screened for diseases.  Follow your health care provider's instructions on monitoring your cholesterol and blood pressure. This information is not intended to replace advice given to you by your health care provider. Make sure you discuss any questions you have with your health care provider. Document Revised: 09/02/2018 Document Reviewed: 09/02/2018 Elsevier Patient Education  2020 [employer]  

## 2020-06-12 NOTE — Progress Notes (Signed)
Subjective:    Patient ID: Laura [name], female    DOB: 05-17-1986, 34 y.o.   MRN: [id number]  HPI  Patient presents the clinic today for her annual exam.  She is also due to follow-up chronic conditions.  GAD: She is not sure what her triggers are.  She feels like her symptoms are well controlled on Escitalopram.  She is not currently seeing a therapist.  She denies depression, SI/HI.  Von Willebrand Disease: She denies s/s of bleeding.  She does not follow with hematology.  Hx of Melanoma: Excised and followed by [facility] dermatology.  Flu: 06/2019 Tetanus: 07/2012 Covid: Moderna Pap smear: 01/2017, GYN Dentist: bianually  Diet: She does eat meat. She consume fruits and veggies daily. She does eat fried foods. She drinks mostly water, coffee. Exercise: Walking the dogs  Review of Systems      Past Medical History:  Diagnosis Date  . History of chicken pox   . Melanoma ([place])    left upper arm  . Von Willebrand disease ([place])     Current Outpatient Medications  Medication Sig Dispense Refill  . desmopressin (STIMATE) 1.5 MG/ML SOLN Place 1 spray (0.15 mg total) into the nose daily. (Patient not taking: Reported on 05/03/2019) 1 Bottle 0  . escitalopram (LEXAPRO) 10 MG tablet Take 1 tablet (10 mg total) by mouth daily. 90 tablet 0  . ibuprofen (ADVIL,MOTRIN) 800 MG tablet Take 800 mg by mouth every 8 (eight) hours as needed.    . Multiple Vitamin (MULTIVITAMIN) tablet Take 1 tablet by mouth daily.     No current facility-administered medications for this visit.    Allergies  Allergen Reactions  . Codeine Shortness Of Breath    Family History  Problem Relation Age of Onset  . Cancer Maternal Grandfather        oral  . Heart disease Maternal Grandfather   . Alcohol abuse Maternal Grandfather   . Breast cancer Paternal Aunt 44  . Depression Mother   . Anxiety disorder Mother   . Heart disease Paternal Grandfather     Social History   Socioeconomic History  .  Marital status: Married    Spouse name: Not on file  . Number of children: Not on file  . Years of education: Not on file  . Highest education level: Not on file  Occupational History  . Occupation: Product manager: [employer]  Tobacco Use  . Smoking status: Never Smoker  . Smokeless tobacco: Never Used  Substance and Sexual Activity  . Alcohol use: Yes    Comment: occ.  . Drug use: No  . Sexual activity: Yes    Partners: Male  Other Topics Concern  . Not on file  Social History Narrative  . Not on file   Social Determinants of Health   Financial Resource Strain:   . Difficulty of Paying Living Expenses: Not on file  Food Insecurity:   . Worried About Charity fundraiser in the Last Year: Not on file  . Ran Out of Food in the Last Year: Not on file  Transportation Needs:   . Lack of Transportation (Medical): Not on file  . Lack of Transportation (Non-Medical): Not on file  Physical Activity:   . Days of Exercise per Week: Not on file  . Minutes of Exercise per Session: Not on file  Stress:   . Feeling of Stress : Not on file  Social Connections:   . Frequency of Communication  with Friends and Family: Not on file  . Frequency of Social Gatherings with Friends and Family: Not on file  . Attends Religious Services: Not on file  . Active Member of Clubs or Organizations: Not on file  . Attends Archivist Meetings: Not on file  . Marital Status: Not on file  Intimate Partner Violence:   . Fear of Current or Ex-Partner: Not on file  . Emotionally Abused: Not on file  . Physically Abused: Not on file  . Sexually Abused: Not on file     Constitutional: Denies fever, malaise, fatigue, headache or abrupt weight changes.  HEENT: Denies eye pain, eye redness, ear pain, ringing in the ears, wax buildup, runny nose, nasal congestion, bloody nose, or sore throat. Respiratory: Denies difficulty breathing, shortness of breath, cough or sputum production.     Cardiovascular: Denies chest pain, chest tightness, palpitations or swelling in the hands or feet.  Gastrointestinal: Pt reports intermittent reflux. Denies abdominal pain, bloating, constipation, diarrhea or blood in the stool.  GU: Pt reports difficulty conceiving. Denies urgency, frequency, pain with urination, burning sensation, blood in urine, odor or discharge. Musculoskeletal: Denies decrease in range of motion, difficulty with gait, muscle pain or joint pain and swelling.  Skin: Denies redness, rashes, lesions or ulcercations.  Neurological: Denies dizziness, difficulty with memory, difficulty with speech or problems with balance and coordination.  Psych: Pt has a history of anxiety. Denies depression, SI/HI.  No other specific complaints in a complete review of systems (except as listed in HPI above).  Objective:   Physical Exam  BP 106/70   Pulse 81   Temp 98.1 F (36.7 C) (Temporal)   Ht 5' 1.5" (1.562 m)   Wt 128 lb (58.1 kg)   SpO2 98%   BMI 23.79 kg/m   Wt Readings from Last 3 Encounters:  05/03/19 117 lb 12 oz (53.4 kg)  04/27/18 105 lb (47.6 kg)  12/04/17 106 lb (48.1 kg)    General: Appears her stated age, well developed, well nourished in NAD. Skin: Warm, dry and intact. No rashes noted. HEENT: Head: normal shape and size; Eyes: sclera white, no icterus, conjunctiva pink, PERRLA and EOMs intact;  Neck:  Neck supple, trachea midline. No masses, lumps or thyromegaly present.  Cardiovascular: Normal rate and rhythm. S1,S2 noted.  No murmur, rubs or gallops noted. No JVD or BLE edema.  Pulmonary/Chest: Normal effort and positive vesicular breath sounds. No respiratory distress. No wheezes, rales or ronchi noted.  Abdomen: Soft and nontender. Normal bowel sounds. No distention or masses noted. Liver, spleen and kidneys non palpable. Musculoskeletal: Strength 5/5 BUE/BLE. No difficulty with gait.  Neurological: Alert and oriented. Cranial nerves II-XII grossly  intact. Coordination normal.  Psychiatric: Mood and affect normal. Behavior is normal. Judgment and thought content normal.     BMET    Component Value Date/Time   NA 137 05/03/2019 1049   NA 138 02/18/2017 0840   K 4.0 05/03/2019 1049   CL 102 05/03/2019 1049   CO2 28 05/03/2019 1049   GLUCOSE 83 05/03/2019 1049   BUN 9 05/03/2019 1049   BUN 11 02/18/2017 0840   CREATININE 0.71 05/03/2019 1049   CREATININE 0.72 08/16/2013 1538   CALCIUM 9.6 05/03/2019 1049   GFRNONAA 106 02/18/2017 0840   GFRAA 122 02/18/2017 0840    Lipid Panel     Component Value Date/Time   CHOL 186 05/03/2019 1049   CHOL 165 02/18/2017 0840   TRIG 83.0 05/03/2019 1049  HDL 68.50 05/03/2019 1049   HDL 74 02/18/2017 0840   CHOLHDL 3 05/03/2019 1049   VLDL 16.6 05/03/2019 1049   LDLCALC 101 (H) 05/03/2019 1049   LDLCALC 76 02/18/2017 0840    CBC    Component Value Date/Time   WBC 5.4 05/03/2019 1049   RBC 3.99 05/03/2019 1049   HGB 12.4 05/03/2019 1049   HGB 13.0 02/18/2017 0840   HCT 37.2 05/03/2019 1049   HCT 39.3 02/18/2017 0840   PLT 344.0 05/03/2019 1049   PLT 336 02/18/2017 0840   MCV 93.1 05/03/2019 1049   MCV 91 02/18/2017 0840   MCH 30.1 02/18/2017 0840   MCH 30.4 08/16/2013 1538   MCHC 33.4 05/03/2019 1049   RDW 13.0 05/03/2019 1049   RDW 13.1 02/18/2017 0840    Hgb A1C No results found for: HGBA1C          Assessment & Plan:  Preventative Health Maintenance:  Flu shot today Tetanus UTD Covid UTD Pap smear due, she will call GYN to schedule Encouraged her to consume a balanced diet and exercise regimen Advised her to see a dentist annually Will check CBC, CMET, TSH and Lipid profile  Difficulty Conceiving:  TSH today Follow up with GYN  RTC in 1 year, sooner if needed Webb Silversmith, NP This visit occurred during the SARS-CoV-2 public health emergency.  Safety protocols were in place, including screening questions prior to the visit, additional usage of  staff PPE, and extensive cleaning of exam room while observing appropriate contact time as indicated for disinfecting solutions.

## 2020-06-12 NOTE — Assessment & Plan Note (Signed)
Continue Escitalopram Support offered

## 2020-06-12 NOTE — Assessment & Plan Note (Signed)
She will continue to follow with derm.

## 2020-06-13 ENCOUNTER — Encounter: Payer: Self-pay | Admitting: Internal Medicine

## 2020-06-13 DIAGNOSIS — N289 Disorder of kidney and ureter, unspecified: Secondary | ICD-10-CM

## 2020-06-13 LAB — LIPID PANEL
Cholesterol: 172 mg/dL (ref 0–200)
HDL: 58.2 mg/dL (ref >39.00)
LDL Cholesterol: 81 mg/dL (ref 0–99)
NonHDL: 113.42
Total CHOL/HDL Ratio: 3
Triglycerides: 161 mg/dL — ABNORMAL HIGH (ref 0.0–149.0)
VLDL: 32.2 mg/dL (ref 0.0–40.0)

## 2020-06-13 LAB — COMPREHENSIVE METABOLIC PANEL
ALT: 9 U/L (ref 0–35)
AST: 13 U/L (ref 0–37)
Albumin: 4.7 g/dL (ref 3.5–5.2)
Alkaline Phosphatase: 46 U/L (ref 39–117)
BUN: 12 mg/dL (ref 6–23)
CO2: 29 mEq/L (ref 19–32)
Calcium: 9.5 mg/dL (ref 8.4–10.5)
Chloride: 103 mEq/L (ref 96–112)
Creatinine, Ser: 1.06 mg/dL (ref 0.40–1.20)
GFR: 58.79 mL/min — ABNORMAL LOW (ref >60.00)
Glucose, Bld: 81 mg/dL (ref 70–99)
Potassium: 3.8 mEq/L (ref 3.5–5.1)
Sodium: 139 mEq/L (ref 135–145)
Total Bilirubin: 0.3 mg/dL (ref 0.2–1.2)
Total Protein: 7.2 g/dL (ref 6.0–8.3)

## 2020-06-13 LAB — CBC
HCT: 36.2 % (ref 36.0–46.0)
Hemoglobin: 12.1 g/dL (ref 12.0–15.0)
MCHC: 33.3 g/dL (ref 30.0–36.0)
MCV: 92.3 fl (ref 78.0–100.0)
Platelets: 355 10*3/uL (ref 150.0–400.0)
RBC: 3.92 Mil/uL (ref 3.87–5.11)
RDW: 13 % (ref 11.5–15.5)
WBC: 8.2 10*3/uL (ref 4.0–10.5)

## 2020-06-13 LAB — TSH: TSH: 1.21 u[IU]/mL (ref 0.35–4.50)

## 2020-07-13 ENCOUNTER — Other Ambulatory Visit (INDEPENDENT_AMBULATORY_CARE_PROVIDER_SITE_OTHER): Payer: BC Managed Care – PPO

## 2020-07-13 ENCOUNTER — Other Ambulatory Visit: Payer: Self-pay

## 2020-07-13 DIAGNOSIS — N289 Disorder of kidney and ureter, unspecified: Secondary | ICD-10-CM

## 2020-07-14 LAB — BASIC METABOLIC PANEL
BUN: 8 mg/dL (ref 6–23)
CO2: 28 mEq/L (ref 19–32)
Calcium: 9.2 mg/dL (ref 8.4–10.5)
Chloride: 101 mEq/L (ref 96–112)
Creatinine, Ser: 0.73 mg/dL (ref 0.40–1.20)
GFR: 106.35 mL/min (ref >60.00)
Glucose, Bld: 74 mg/dL (ref 70–99)
Potassium: 3.4 mEq/L — ABNORMAL LOW (ref 3.5–5.1)
Sodium: 137 mEq/L (ref 135–145)

## 2020-08-04 ENCOUNTER — Other Ambulatory Visit: Payer: Self-pay | Admitting: Internal Medicine

## 2020-10-23 ENCOUNTER — Encounter: Payer: Self-pay | Admitting: *Deleted

## 2020-10-24 ENCOUNTER — Ambulatory Visit (INDEPENDENT_AMBULATORY_CARE_PROVIDER_SITE_OTHER): Payer: BC Managed Care – PPO | Admitting: Family Medicine

## 2020-10-24 ENCOUNTER — Other Ambulatory Visit: Payer: Self-pay

## 2020-10-24 ENCOUNTER — Encounter: Payer: Self-pay | Admitting: Family Medicine

## 2020-10-24 ENCOUNTER — Other Ambulatory Visit (HOSPITAL_COMMUNITY)
Admission: RE | Admit: 2020-10-24 | Discharge: 2020-10-24 | Disposition: A | Discharge status: Home / Self Care | Payer: BC Managed Care – PPO | Source: Ambulatory Visit | Attending: Family Medicine | Admitting: Family Medicine

## 2020-10-24 VITALS — BP 133/79 | HR 109 | Wt 128.0 lb

## 2020-10-24 DIAGNOSIS — C439 Malignant melanoma of skin, unspecified: Secondary | ICD-10-CM

## 2020-10-24 DIAGNOSIS — O0991 Supervision of high risk pregnancy, unspecified, first trimester: Secondary | ICD-10-CM | POA: Insufficient documentation

## 2020-10-24 DIAGNOSIS — D6800 Von Willebrand disease, unspecified: Secondary | ICD-10-CM

## 2020-10-24 DIAGNOSIS — O09521 Supervision of elderly multigravida, first trimester: Secondary | ICD-10-CM

## 2020-10-24 DIAGNOSIS — O0990 Supervision of high risk pregnancy, unspecified, unspecified trimester: Secondary | ICD-10-CM | POA: Diagnosis present

## 2020-10-24 DIAGNOSIS — O09529 Supervision of elderly multigravida, unspecified trimester: Secondary | ICD-10-CM | POA: Insufficient documentation

## 2020-10-24 DIAGNOSIS — Z3A10 10 weeks gestation of pregnancy: Secondary | ICD-10-CM | POA: Diagnosis not present

## 2020-10-24 NOTE — Patient Instructions (Signed)

## 2020-10-24 NOTE — Progress Notes (Signed)
Subjective:   Laura [name] is a 35 y.o. G1P0000 at [redacted]w[redacted]d by LMP, early ultrasound being seen today for her first obstetrical visit.  Her obstetrical history is significant for advanced maternal age and VonWillebrand's disease. Pregnancy history fully reviewed.  Patient reports fatigue and nausea.  HISTORY: OB History  Gravida Para Term Preterm AB Living  1 0 0 0 0 0  SAB IAB Ectopic Multiple Live Births  0 0 0 0 0    # Outcome Date GA Lbr Len/2nd Weight Sex Delivery Anes PTL Lv  1 Current            Last pap smear was  2018 and was normal Past Medical History:  Diagnosis Date  . History of chicken pox   . Melanoma ([place])    left upper arm  . Von Willebrand disease ([place])    History reviewed. No pertinent surgical history. Family History  Problem Relation Age of Onset  . Cancer Maternal Grandfather        oral  . Heart disease Maternal Grandfather   . Alcohol abuse Maternal Grandfather   . Breast cancer Paternal Aunt 62  . Depression Mother   . Anxiety disorder Mother   . Heart disease Paternal Grandfather    Social History   Tobacco Use  . Smoking status: Never Smoker  . Smokeless tobacco: Never Used  Vaping Use  . Vaping Use: Never used  Substance Use Topics  . Alcohol use: Yes    Comment: occ.  . Drug use: No   Allergies  Allergen Reactions  . Codeine Shortness Of Breath   Current Outpatient Medications on File Prior to Visit  Medication Sig Dispense Refill  . escitalopram (LEXAPRO) 10 MG tablet TAKE 1 TABLET BY MOUTH EVERY DAY 90 tablet 1  . Prenatal Vit-Fe Fumarate-FA (MULTIVITAMIN-PRENATAL) 27-0.8 MG TABS tablet Take 1 tablet by mouth daily at 12 noon.     No current facility-administered medications on file prior to visit.     Exam   Vitals:   10/24/20 0822  BP: 133/79  Pulse: (!) 109  Weight: 128 lb (58.1 kg)   Fetal Heart Rate (bpm): 166  Uterus:     Pelvic Exam: Perineum: no hemorrhoids, normal perineum   Vulva: normal external  genitalia, no lesions   Vagina:  normal mucosa, normal discharge   Cervix: no lesions and normal, pap smear done.    Adnexa: normal adnexa and no mass, fullness, tenderness   Bony Pelvis: average  System: General: well-developed, well-nourished female in no acute distress   Breast:  normal appearance, no masses or tenderness   Skin: normal coloration and turgor, no rashes   Neurologic: oriented, normal, negative, normal mood   Extremities: normal strength, tone, and muscle mass, ROM of all joints is normal   HEENT PERRLA, extraocular movement intact and sclera clear, anicteric   Mouth/Teeth mucous membranes moist, pharynx normal without lesions and dental hygiene good   Neck supple and no masses   Cardiovascular: regular rate and rhythm   Respiratory:  no respiratory distress, normal breath sounds   Abdomen: soft, non-tender; bowel sounds normal; no masses,  no organomegaly     Assessment:   Pregnancy: G1P0000 Patient Active Problem List   Diagnosis Date Noted  . Supervision of high risk pregnancy, antepartum 10/24/2020  . AMA (advanced maternal age) multigravida 35+ 10/24/2020  . GAD (generalized anxiety disorder) 04/27/2018  . Melanoma of skin ([place]) 03/18/2017  . Von Willebrand's disease ([place])  08/16/2013     Plan:  1. Supervision of high risk pregnancy, antepartum  - CBC/D/Plt+RPR+Rh+ABO+Rub Ab... - Culture, OB Urine - [place] MFM OB COMP + 14 WK; Future - Cytology - PAP - Babyscripts Schedule Optimization  2. Multigravida of advanced maternal age in first trimester Genetics Will discuss ASA with MFM - Genetic Screening  3. Von Willebrand's disease [facility]) Refer to heme to see what testing is needed and to see if there is treatment needed at time of delivery and postnatally - Ambulatory referral to Hematology  4. Melanoma of skin ([place]) S/p removal with negative margins in 2019 on her elbow F/u with derm q 6 months.   Initial labs drawn. Continue prenatal  vitamins. Genetic Screening discussed, NIPS: ordered. Ultrasound discussed; fetal anatomic survey: discussed. Problem list reviewed and updated. The nature of [facility] with multiple MDs and other Advanced Practice Providers was explained to patient; also emphasized that residents, students are part of our team. Routine obstetric precautions reviewed. Return in 4 weeks (on 11/21/2020).

## 2020-10-24 NOTE — Progress Notes (Signed)
DATING AND VIABILITY SONOGRAM   Laura [name] is a 35 y.o. year old G1P0000 with LMP Patient's last menstrual period was 08/10/2020. which would correlate to  [redacted]w[redacted]d weeks gestation.  She has regular menstrual cycles.   She is here today for a confirmatory initial sonogram.    GESTATION: SINGLETON yes  FETAL ACTIVITY:          Heart rate        166          The fetus is active.   GESTATIONAL AGE AND  BIOMETRICS:  Gestational criteria: Estimated Date of Delivery: 05/17/21 by LMP now at [redacted]w[redacted]d  Previous Scans:0  GESTATIONAL SAC            mm          weeks  CROWN RUMP LENGTH          3.95cm         10.5weeks                                                   AVERAGE EGA(BY THIS SCAN):  10.5 weeks  WORKING EDD( LMP ):  05/17/2021     TECHNICIAN COMMENTS:  Patient informed that the ultrasound is considered a limited obstetric ultrasound and is not intended to be a complete ultrasound exam.  Patient also informed that the ultrasound is not being completed with the intent of assessing for fetal or placental anomalies or any pelvic abnormalities. Explained that the purpose of today's ultrasound is to assess for fetal heart rate.  Patient acknowledges the purpose of the exam and the limitations of the study.           A copy of this report including all images has been saved and backed up to a second source for retrieval if needed. All measures and details of the anatomical scan, placentation, fluid volume and pelvic anatomy are contained in that report.  Crosby Oyster 10/24/2020 8:49 AM

## 2020-10-25 LAB — CBC/D/PLT+RPR+RH+ABO+RUB AB...
Antibody Screen: NEGATIVE
Basophils Absolute: 0 10*3/uL (ref 0.0–0.2)
Basos: 0 %
EOS (ABSOLUTE): 0 10*3/uL (ref 0.0–0.4)
Eos: 0 %
HCV Ab: <0.1 s/co ratio (ref 0.0–0.9)
HIV Screen 4th Generation wRfx: NONREACTIVE
Hematocrit: 35.1 % (ref 34.0–46.6)
Hemoglobin: 11.8 g/dL (ref 11.1–15.9)
Hepatitis B Surface Ag: NEGATIVE
Immature Grans (Abs): 0.1 10*3/uL (ref 0.0–0.1)
Immature Granulocytes: 1 %
Lymphocytes Absolute: 1.5 10*3/uL (ref 0.7–3.1)
Lymphs: 14 %
MCH: 30.7 pg (ref 26.6–33.0)
MCHC: 33.6 g/dL (ref 31.5–35.7)
MCV: 91 fL (ref 79–97)
Monocytes Absolute: 0.5 10*3/uL (ref 0.1–0.9)
Monocytes: 5 %
Neutrophils Absolute: 8.6 10*3/uL — ABNORMAL HIGH (ref 1.4–7.0)
Neutrophils: 80 %
Platelets: 357 10*3/uL (ref 150–450)
RBC: 3.84 x10E6/uL (ref 3.77–5.28)
RDW: 12.2 % (ref 11.7–15.4)
RPR Ser Ql: NONREACTIVE
Rh Factor: POSITIVE
Rubella Antibodies, IGG: 6.1 index (ref >0.99)
WBC: 10.7 10*3/uL (ref 3.4–10.8)

## 2020-10-25 LAB — HCV INTERPRETATION

## 2020-10-27 LAB — CYTOLOGY - PAP
Chlamydia: NEGATIVE
Comment: NEGATIVE
Comment: NEGATIVE
Comment: NORMAL
Diagnosis: NEGATIVE
High risk HPV: NEGATIVE
Neisseria Gonorrhea: NEGATIVE

## 2020-10-27 LAB — CULTURE, OB URINE

## 2020-10-27 LAB — URINE CULTURE, OB REFLEX: Organism ID, Bacteria: NO GROWTH

## 2020-11-03 ENCOUNTER — Telehealth: Payer: Self-pay | Admitting: Radiology

## 2020-11-03 NOTE — Telephone Encounter (Signed)
Left message for patient to call for Panorama screening results

## 2020-11-06 ENCOUNTER — Telehealth: Payer: Self-pay | Admitting: Radiology

## 2020-11-06 ENCOUNTER — Encounter: Payer: Self-pay | Admitting: Radiology

## 2020-11-06 NOTE — Telephone Encounter (Signed)
Patient was informed of panorama results not including fetal sex

## 2020-11-08 ENCOUNTER — Encounter: Payer: Self-pay | Admitting: Hematology and Oncology

## 2020-11-08 ENCOUNTER — Encounter: Payer: Self-pay | Admitting: Radiology

## 2020-11-08 ENCOUNTER — Inpatient Hospital Stay: Payer: BC Managed Care – PPO

## 2020-11-08 ENCOUNTER — Other Ambulatory Visit: Payer: Self-pay

## 2020-11-08 ENCOUNTER — Inpatient Hospital Stay: Payer: BC Managed Care – PPO | Attending: Hematology and Oncology | Admitting: Hematology and Oncology

## 2020-11-08 VITALS — BP 118/66 | HR 94 | Temp 97.6°F | Resp 18 | Ht 61.0 in | Wt 129.6 lb

## 2020-11-08 DIAGNOSIS — Z79899 Other long term (current) drug therapy: Secondary | ICD-10-CM | POA: Diagnosis not present

## 2020-11-08 DIAGNOSIS — Z85820 Personal history of malignant melanoma of skin: Secondary | ICD-10-CM | POA: Diagnosis not present

## 2020-11-08 DIAGNOSIS — D6800 Von Willebrand disease, unspecified: Secondary | ICD-10-CM

## 2020-11-08 DIAGNOSIS — Z8249 Family history of ischemic heart disease and other diseases of the circulatory system: Secondary | ICD-10-CM | POA: Diagnosis not present

## 2020-11-08 DIAGNOSIS — Z3A11 11 weeks gestation of pregnancy: Secondary | ICD-10-CM | POA: Insufficient documentation

## 2020-11-08 DIAGNOSIS — Z803 Family history of malignant neoplasm of breast: Secondary | ICD-10-CM | POA: Insufficient documentation

## 2020-11-08 DIAGNOSIS — O99111 Other diseases of the blood and blood-forming organs and certain disorders involving the immune mechanism complicating pregnancy, first trimester: Secondary | ICD-10-CM | POA: Diagnosis present

## 2020-11-08 LAB — CBC WITH DIFFERENTIAL (CANCER CENTER ONLY)
Abs Immature Granulocytes: 0.07 10*3/uL (ref 0.00–0.07)
Basophils Absolute: 0 10*3/uL (ref 0.0–0.1)
Basophils Relative: 0 %
Eosinophils Absolute: 0.1 10*3/uL (ref 0.0–0.5)
Eosinophils Relative: 1 %
HCT: 32.6 % — ABNORMAL LOW (ref 36.0–46.0)
Hemoglobin: 10.9 g/dL — ABNORMAL LOW (ref 12.0–15.0)
Immature Granulocytes: 1 %
Lymphocytes Relative: 19 %
Lymphs Abs: 1.7 10*3/uL (ref 0.7–4.0)
MCH: 30.3 pg (ref 26.0–34.0)
MCHC: 33.4 g/dL (ref 30.0–36.0)
MCV: 90.6 fL (ref 80.0–100.0)
Monocytes Absolute: 0.5 10*3/uL (ref 0.1–1.0)
Monocytes Relative: 6 %
Neutro Abs: 6.4 10*3/uL (ref 1.7–7.7)
Neutrophils Relative %: 73 %
Platelet Count: 340 10*3/uL (ref 150–400)
RBC: 3.6 MIL/uL — ABNORMAL LOW (ref 3.87–5.11)
RDW: 12.8 % (ref 11.5–15.5)
WBC Count: 8.8 10*3/uL (ref 4.0–10.5)
nRBC: 0 % (ref 0.0–0.2)

## 2020-11-08 LAB — CMP (CANCER CENTER ONLY)
ALT: 9 U/L (ref 0–44)
AST: 12 U/L — ABNORMAL LOW (ref 15–41)
Albumin: 3.8 g/dL (ref 3.5–5.0)
Alkaline Phosphatase: 46 U/L (ref 38–126)
Anion gap: 7 (ref 5–15)
BUN: 5 mg/dL — ABNORMAL LOW (ref 6–20)
CO2: 23 mmol/L (ref 22–32)
Calcium: 8.9 mg/dL (ref 8.9–10.3)
Chloride: 107 mmol/L (ref 98–111)
Creatinine: 0.68 mg/dL (ref 0.44–1.00)
GFR, Estimated: >60 mL/min (ref >60)
Glucose, Bld: 94 mg/dL (ref 70–99)
Potassium: 3.9 mmol/L (ref 3.5–5.1)
Sodium: 137 mmol/L (ref 135–145)
Total Bilirubin: 0.4 mg/dL (ref 0.3–1.2)
Total Protein: 7 g/dL (ref 6.5–8.1)

## 2020-11-08 LAB — PROTIME-INR
INR: 1 (ref 0.8–1.2)
Prothrombin Time: 12.5 seconds (ref 11.4–15.2)

## 2020-11-08 LAB — APTT: aPTT: 28 seconds (ref 24–36)

## 2020-11-08 NOTE — Progress Notes (Signed)
[place] [phone]   [phone]  INITIAL CONSULT NOTE  Patient Care Team: Jearld Fenton, NP as PCP - General (Internal Medicine)  Hematological/Oncological History # History of von Willebrand disease  CHIEF COMPLAINTS/PURPOSE OF CONSULTATION:  "History of von Willebrand disease "  HISTORY OF PRESENTING ILLNESS:  Laura [name] 35 y.o. female with medical history significant for melanoma under the care of dermatology who presents for evaluation of a prior diagnosis of von Willebrand.   On review of the previous records [name] carries a diagnosis of von Willebrand disease.  We do not have any records in our system that show that she has testing.  Information regarding her history of von Willebrand's disease is provided by the patient herself.  She notes that she was initially diagnosed at approximate 63 to 35 years old after she had teeth pulled and they could not stop the bleeding.  This occurred in [place] when she was initially tested.  Her family was tested as well and they found that both of her parents and her brother also carry the diagnosis.  She subsequently underwent dental procedures in the third grade and received DDAVP and in the fifth grade and received Stimate.  She had no bleeding complications of these procedures.  Most recently in 2018 she had melanoma resected from her left arm and received Stimate before the procedure with no bleeding complications.  Of note the patient's mother recently underwent an OB/GYN procedure in April 2021 for removal of cyst from the uterus and did not have any bleeding complications and was told that her low levels of von Willebrand factor had resolved.  On exam today [name] reports that she is [redacted] weeks pregnant.  This is her first pregnancy and she has had no prior pregnancies or deliveries.  She is had no major procedures performed since the melanoma resection in 2018.  On a day-to-day basis  she does not have any issues with bleeding, though she is prone to bruising.  She notes that her menstrual cycles occur regularly every 28-day intervals and last approximately 5 to 7 days and that on her heaviest days she goes to about 4 pads per day.  On further discussion her family history is remarkable for von Willebrand syndrome in her mom, her dad, and her brother.  She is of Irish/German/[place]ish ancestry.  She is a never smoker and drinks alcohol occasionally prior to her pregnancy.  She currently works as a first Land.  Her expected delivery date at this time is 05/17/2021.  She currently denies any fevers, chills, sweats, nausea, vomiting or diarrhea.  Full 10 point ROS is listed below.  MEDICAL HISTORY:  Past Medical History:  Diagnosis Date  . History of chicken pox   . Melanoma ([place])    left upper arm  . Von Willebrand disease ([place])     SURGICAL HISTORY: History reviewed. No pertinent surgical history.  SOCIAL HISTORY: Social History   Socioeconomic History  . Marital status: Married    Spouse name: Not on file  . Number of children: Not on file  . Years of education: Not on file  . Highest education level: Not on file  Occupational History  . Occupation: Product manager: [employer]  Tobacco Use  . Smoking status: Never Smoker  . Smokeless tobacco: Never Used  Vaping Use  . Vaping Use: Never used  Substance and Sexual Activity  . Alcohol use: Yes  Comment: occ.  . Drug use: No  . Sexual activity: Yes    Partners: Male    Birth control/protection: None  Other Topics Concern  . Not on file  Social History Narrative  . Not on file   Social Determinants of Health   Financial Resource Strain: Not on file  Food Insecurity: Not on file  Transportation Needs: Not on file  Physical Activity: Not on file  Stress: Not on file  Social Connections: Not on file  Intimate Partner Violence: Not on file    FAMILY HISTORY: Family History  Problem  Relation Age of Onset  . Cancer Maternal Grandfather        oral  . Heart disease Maternal Grandfather   . Alcohol abuse Maternal Grandfather   . Breast cancer Paternal Aunt 21  . Depression Mother   . Anxiety disorder Mother   . Heart disease Paternal Grandfather     ALLERGIES:  is allergic to codeine.  MEDICATIONS:  Current Outpatient Medications  Medication Sig Dispense Refill  . escitalopram (LEXAPRO) 10 MG tablet TAKE 1 TABLET BY MOUTH EVERY DAY 90 tablet 1  . Prenatal Vit-Fe Fumarate-FA (MULTIVITAMIN-PRENATAL) 27-0.8 MG TABS tablet Take 1 tablet by mouth daily at 12 noon.     No current facility-administered medications for this visit.    REVIEW OF SYSTEMS:   Constitutional: ( - ) fevers, ( - )  chills , ( - ) night sweats Eyes: ( - ) blurriness of vision, ( - ) double vision, ( - ) watery eyes Ears, nose, mouth, throat, and face: ( - ) mucositis, ( - ) sore throat Respiratory: ( - ) cough, ( - ) dyspnea, ( - ) wheezes Cardiovascular: ( - ) palpitation, ( - ) chest discomfort, ( - ) lower extremity swelling Gastrointestinal:  ( - ) nausea, ( - ) heartburn, ( - ) change in bowel habits Skin: ( - ) abnormal skin rashes Lymphatics: ( - ) new lymphadenopathy, ( - ) easy bruising Neurological: ( - ) numbness, ( - ) tingling, ( - ) new weaknesses Behavioral/Psych: ( - ) mood change, ( - ) new changes  All other systems were reviewed with the patient and are negative.  PHYSICAL EXAMINATION:  Vitals:   11/08/20 0906  BP: 118/66  Pulse: 94  Resp: 18  Temp: 97.6 F (36.4 C)  SpO2: 100%   Filed Weights   11/08/20 0906  Weight: 129 lb 9.6 oz (58.8 kg)    GENERAL: well appearing young Caucasian female in NAD  SKIN: skin color, texture, turgor are normal, no rashes or significant lesions EYES: conjunctiva are pink and non-injected, sclera clear LUNGS: clear to auscultation and percussion with normal breathing effort HEART: regular rate & rhythm and no murmurs and no  lower extremity edema Musculoskeletal: no cyanosis of digits and no clubbing  PSYCH: alert & oriented x 3, fluent speech NEURO: no focal motor/sensory deficits  LABORATORY DATA:  I have reviewed the data as listed CBC Latest Ref Rng & Units 10/24/2020 06/12/2020 05/03/2019  WBC 3.4 - 10.8 x10E3/uL 10.7 8.2 5.4  Hemoglobin 11.1 - 15.9 g/dL 11.8 12.1 12.4  Hematocrit 34.0 - 46.6 % 35.1 36.2 37.2  Platelets 150 - 450 x10E3/uL 357 355.0 344.0    CMP Latest Ref Rng & Units 07/13/2020 06/12/2020 05/03/2019  Glucose 70 - 99 mg/dL 74 81 83  BUN 6 - 23 mg/dL 8 12 9   Creatinine 0.40 - 1.20 mg/dL 0.73 1.06 0.71  Sodium 135 -  145 mEq/L 137 139 137  Potassium 3.5 - 5.1 mEq/L 3.4(L) 3.8 4.0  Chloride 96 - 112 mEq/L 101 103 102  CO2 19 - 32 mEq/L 28 29 28   Calcium 8.4 - 10.5 mg/dL 9.2 9.5 9.6  Total Protein 6.0 - 8.3 g/dL - 7.2 7.3  Total Bilirubin 0.2 - 1.2 mg/dL - 0.3 0.5  Alkaline Phos 39 - 117 U/L - 46 45  AST 0 - 37 U/L - 13 12  ALT 0 - 35 U/L - 9 10    RADIOGRAPHIC STUDIES: No results found.  ASSESSMENT & PLAN FLORENTINA MARQUART 35 y.o. female with medical history significant for melanoma under the care of dermatology who presents for evaluation of a prior diagnosis of von Willebrand.   The patient carries a diagnosis of von Willebrand's disease made in [place].  We have no recent labs to suggest we will give [place] baseline for her von Willebrand factor levels.  As such we will need to repeat that testing today.  Fortunately von Willebrand levels tend to increase as 1 gets older and increase with pregnancy.  It is possible the patient has von Willebrand factor which is within the normal limits.  We will order testing today in order to confirm this.  In the event the patient has normal levels we will plan to see her back in approximately 5 months 1 month for delivery in order to assure these are stable.  Additionally we will see her a few months after delivery to see if her von Willebrand  levels have dipped back down into the deficient range.  In the event the patient is found to have levels consistent with von Willebrand disease I would recommend referral to [facility] hemophilia center and OB/GYN for high risk delivery.  I discussed this plan with the patient today and she voiced her understanding moving forward.  # History of von Willebrand disease --Patient has a remote history of von Willebrand disease diagnosed when he was approximately 67 to 35 years old. --She is had no major bleeding complications since dental procedures performed during grade school. --Today we will recheck her von Willebrand panel to include von Willebrand factor levels, factor VIII levels, PTT, and von Willebrand multimers. --Levels do increase during pregnancy and is possible the patient has normal levels.  We will plan to check her back approximately 1 month before pregnancy in order to assure her levels are still within acceptable range for delivery. --In the event the patient has confirmed von Willebrand disease I would recommend referral to [place] for consideration of a high risk delivery at their hemophilia center. --Plan for return to clinic in approximately 5 months time in order to see her 1 month before delivery.  Orders Placed This Encounter  Procedures  . CBC with Differential (Cancer Center Only)    Standing Status:   Future    Number of Occurrences:   1    Standing Expiration Date:   11/08/2021  . CMP ([place] only)    Standing Status:   Future    Number of Occurrences:   1    Standing Expiration Date:   11/08/2021  . Von Willebrand panel    Standing Status:   Future    Number of Occurrences:   1    Standing Expiration Date:   11/08/2021  . Von Willebrand Multimeric    Standing Status:   Future    Number of Occurrences:   1  Standing Expiration Date:   11/08/2021  . APTT    Standing Status:   Future    Number of Occurrences:   1    Standing Expiration Date:    11/08/2021  . Protime-INR    Standing Status:   Future    Number of Occurrences:   1    Standing Expiration Date:   11/08/2021    All questions were answered. The patient knows to call the clinic with any problems, questions or concerns.  A total of more than 60 minutes were spent on this encounter and over half of that time was spent on counseling and coordination of care as outlined above.   Ledell Peoples, MD Department of Hematology/Oncology [place] at [facility] Phone: [phone] Pager: [phone] Email: [email or web address]  11/08/2020 9:43 AM

## 2020-11-09 LAB — VON WILLEBRAND PANEL
Coagulation Factor VIII: 132 % (ref 56–140)
Ristocetin Co-factor, Plasma: 87 % (ref 50–200)
Von Willebrand Antigen, Plasma: 88 % (ref 50–200)

## 2020-11-09 LAB — COAG STUDIES INTERP REPORT

## 2020-11-16 ENCOUNTER — Telehealth: Payer: Self-pay | Admitting: *Deleted

## 2020-11-16 NOTE — Telephone Encounter (Signed)
-----   Message from Orson Slick, MD sent at 11/15/2020  8:03 AM EST ----- Please let Mrs. Stahlecker know that her von Verne Carrow tests so far have come back normal. Her von Verne Carrow level is 88% (we start to worry about it when it is <50%). We are waiting on one more specialized von Wilebrand test, but everything so far is reassuring. This may be due to the natural bump of vWF with pregnancy. We will see her closer to the time of delivery and after the delivery to see if it dips below 50%.   ----- Message ----- From: Interface, Lab In [place] Sent: 11/08/2020  10:01 AM EST To: Orson Slick, MD

## 2020-11-16 NOTE — Telephone Encounter (Signed)
TCT patient regarding recent lab results.  No answer but was able to leave vm message for pt to return my call @ [phone]

## 2020-11-21 ENCOUNTER — Ambulatory Visit (INDEPENDENT_AMBULATORY_CARE_PROVIDER_SITE_OTHER): Payer: BC Managed Care – PPO | Admitting: Obstetrics & Gynecology

## 2020-11-21 ENCOUNTER — Other Ambulatory Visit: Payer: Self-pay

## 2020-11-21 VITALS — BP 113/71 | HR 99 | Wt 128.0 lb

## 2020-11-21 DIAGNOSIS — O09522 Supervision of elderly multigravida, second trimester: Secondary | ICD-10-CM

## 2020-11-21 DIAGNOSIS — O99012 Anemia complicating pregnancy, second trimester: Secondary | ICD-10-CM

## 2020-11-21 DIAGNOSIS — D6800 Von Willebrand disease, unspecified: Secondary | ICD-10-CM

## 2020-11-21 DIAGNOSIS — O0990 Supervision of high risk pregnancy, unspecified, unspecified trimester: Secondary | ICD-10-CM

## 2020-11-21 NOTE — Patient Instructions (Addendum)
Ask Heme/Onc about recommendations during delivery and afterwards, and tell them to write the recommendations in their notes.   Second Trimester of Pregnancy  The second trimester of pregnancy is from week 13 through week 27. This is months 4 through 6 of pregnancy. The second trimester is often a time when you feel your best. Your body has adjusted to being pregnant, and you begin to feel better physically. During the second trimester:  Morning sickness has lessened or stopped completely.  You may have more energy.  You may have an increase in appetite. The second trimester is also a time when the unborn baby (fetus) is growing rapidly. At the end of the sixth month, the fetus may be up to 12 inches long and weigh about 1 pounds. You will likely begin to feel the baby move (quickening) between 16 and 20 weeks of pregnancy. Body changes during your second trimester Your body continues to go through many changes during your second trimester. The changes vary and generally return to normal after the baby is born. Physical changes  Your weight will continue to increase. You will notice your lower abdomen bulging out.  You may begin to get stretch marks on your hips, abdomen, and breasts.  Your breasts will continue to grow and to become tender.  Dark spots or blotches (chloasma or mask of pregnancy) may develop on your face.  A dark line from your belly button to the pubic area (linea nigra) may appear.  You may have changes in your hair. These can include thickening of your hair, rapid growth, and changes in texture. Some people also have hair loss during or after pregnancy, or hair that feels dry or thin. Health changes  You may develop headaches.  You may have heartburn.  You may develop constipation.  You may develop hemorrhoids or swollen, bulging veins (varicose veins).  Your gums may bleed and may be sensitive to brushing and flossing.  You may urinate more often because  the fetus is pressing on your bladder.  You may have back pain. This is caused by: ? Weight gain. ? Pregnancy hormones that are relaxing the joints in your pelvis. ? A shift in weight and the muscles that support your balance. Follow these instructions at home: Medicines  Follow your health care provider's instructions regarding medicine use. Specific medicines may be either safe or unsafe to take during pregnancy. Do not take any medicines unless approved by your health care provider.  Take a prenatal vitamin that contains at least 600 micrograms (mcg) of folic acid. Eating and drinking  Eat a healthy diet that includes fresh fruits and vegetables, whole grains, good sources of protein such as meat, eggs, or tofu, and low-fat dairy products.  Avoid raw meat and unpasteurized juice, milk, and cheese. These carry germs that can harm you and your baby.  You may need to take these actions to prevent or treat constipation: ? Drink enough fluid to keep your urine pale yellow. ? Eat foods that are high in fiber, such as beans, whole grains, and fresh fruits and vegetables. ? Limit foods that are high in fat and processed sugars, such as fried or sweet foods. Activity  Exercise only as directed by your health care provider. Most people can continue their usual exercise routine during pregnancy. Try to exercise for 30 minutes at least 5 days a week. Stop exercising if you develop contractions in your uterus.  Stop exercising if you develop pain or cramping in the  lower abdomen or lower back.  Avoid exercising if it is very hot or humid or if you are at a high altitude.  Avoid heavy lifting.  If you choose to, you may have sex unless your health care provider tells you not to. Relieving pain and discomfort  Wear a supportive bra to prevent discomfort from breast tenderness.  Take warm sitz baths to soothe any pain or discomfort caused by hemorrhoids. Use hemorrhoid cream if your health  care provider approves.  Rest with your legs raised (elevated) if you have leg cramps or low back pain.  If you develop varicose veins: ? Wear support hose as told by your health care provider. ? Elevate your feet for 15 minutes, 3-4 times a day. ? Limit salt in your diet. Safety  Wear your seat belt at all times when driving or riding in a car.  Talk with your health care provider if someone is verbally or physically abusive to you. Lifestyle  Do not use hot tubs, steam rooms, or saunas.  Do not douche. Do not use tampons or scented sanitary pads.  Avoid cat litter boxes and soil used by cats. These carry germs that can cause birth defects in the baby and possibly loss of the fetus by miscarriage or stillbirth.  Do not use herbal remedies, alcohol, illegal drugs, or medicines that are not approved by your health care provider. Chemicals in these products can harm your baby.  Do not use any products that contain nicotine or tobacco, such as cigarettes, e-cigarettes, and chewing tobacco. If you need help quitting, ask your health care provider. General instructions  During a routine prenatal visit, your health care provider will do a physical exam and other tests. He or she will also discuss your overall health. Keep all follow-up visits. This is important.  Ask your health care provider for a referral to a local prenatal education class.  Ask for help if you have counseling or nutritional needs during pregnancy. Your health care provider can offer advice or refer you to specialists for help with various needs. Where to find more information  American Pregnancy Association: americanpregnancy.[place] and Gynecologists: PoolDevices.com.pt  Office on [employer]: KeywordPortfolios.com.br Contact a health care provider if you have:  A headache that does not go away when you take medicine.  Vision changes or you see spots in  front of your eyes.  Mild pelvic cramps, pelvic pressure, or nagging pain in the abdominal area.  Persistent nausea, vomiting, or diarrhea.  A bad-smelling vaginal discharge or foul-smelling urine.  Pain when you urinate.  Sudden or extreme swelling of your face, hands, ankles, feet, or legs.  A fever. Get help right away if you:  Have fluid leaking from your vagina.  Have spotting or bleeding from your vagina.  Have severe abdominal cramping or pain.  Have difficulty breathing.  Have chest pain.  Have fainting spells.  Have not felt your baby move for the time period told by your health care provider.  Have new or increased pain, swelling, or redness in an arm or leg. Summary  The second trimester of pregnancy is from week 13 through week 27 (months 4 through 6).  Do not use herbal remedies, alcohol, illegal drugs, or medicines that are not approved by your health care provider. Chemicals in these products can harm your baby.  Exercise only as directed by your health care provider. Most people can continue their usual exercise routine during pregnancy.  Keep all follow-up visits. This is important. This information is not intended to replace advice given to you by your health care provider. Make sure you discuss any questions you have with your health care provider. Document Revised: 02/16/2020 Document Reviewed: 12/23/2019 Elsevier Patient Education  2021 [employer].  Iron-Rich Diet  Iron is a mineral that helps your body to produce hemoglobin. Hemoglobin is a protein in red blood cells that carries oxygen to your body's tissues. Eating too little iron may cause you to feel weak and tired, and it can increase your risk of infection. Iron is naturally found in many foods, and many foods have iron added to them (iron-fortified foods). You may need to follow an iron-rich diet if you do not have enough iron in your body due to certain medical conditions. The amount of  iron that you need each day depends on your age, your sex, and any medical conditions you have. Follow instructions from your health care provider or a diet and nutrition specialist (dietitian) about how much iron you should eat each day. What are tips for following this plan? Reading food labels  Check food labels to see how many milligrams (mg) of iron are in each serving. Cooking  Cook foods in pots and pans that are made from iron.  Take these steps to make it easier for your body to absorb iron from certain foods: ? Soak beans overnight before cooking. ? Soak whole grains overnight and drain them before using. ? Ferment flours before baking, such as by using yeast in bread dough. Meal planning  When you eat foods that contain iron, you should eat them with foods that are high in vitamin C. These include oranges, peppers, tomatoes, potatoes, and mango. Vitamin C helps your body to absorb iron. General information  Take iron supplements only as told by your health care provider. An overdose of iron can be life-threatening. If you were prescribed iron supplements, take them with orange juice or a vitamin C supplement.  When you eat iron-fortified foods or take an iron supplement, you should also eat foods that naturally contain iron, such as meat, poultry, and fish. Eating naturally iron-rich foods helps your body to absorb the iron that is added to other foods or contained in a supplement.  Certain foods and drinks prevent your body from absorbing iron properly. Avoid eating these foods in the same meal as iron-rich foods or with iron supplements. These foods include: ? Coffee, black tea, and red wine. ? Milk, dairy products, and foods that are high in calcium. ? Beans and soybeans. ? Whole grains. What foods should I eat? Fruits Prunes. Raisins. Eat fruits high in vitamin C, such as oranges, grapefruits, and strawberries, alongside iron-rich foods. Vegetables Spinach (cooked).  Green peas. Broccoli. Fermented vegetables. Eat vegetables high in vitamin C, such as leafy greens, potatoes, bell peppers, and tomatoes, alongside iron-rich foods. Grains Iron-fortified breakfast cereal. Iron-fortified whole-wheat bread. Enriched rice. Sprouted grains. Meats and other proteins Beef liver. Oysters. Beef. Shrimp. [place]. Chicken. [place]. Sardines. Chickpeas. Nuts. Tofu. Pumpkin seeds. Beverages Tomato juice. Fresh orange juice. Prune juice. Hibiscus tea. Fortified instant breakfast shakes. Sweets and desserts Blackstrap molasses. Seasonings and condiments Tahini. Fermented soy sauce. Other foods Wheat germ. The items listed above may not be a complete list of recommended foods and beverages. Contact a dietitian for more information. What foods should I avoid? Grains Whole grains. Bran cereal. Bran flour. Oats. Meats and other proteins Soybeans. Products made from soy protein.  Black beans. Lentils. Mung beans. Split peas. Dairy Milk. Cream. Cheese. Yogurt. Cottage cheese. Beverages Coffee. Black tea. Red wine. Sweets and desserts Cocoa. Chocolate. Ice cream. Other foods Basil. Oregano. Large amounts of parsley. The items listed above may not be a complete list of foods and beverages to avoid. Contact a dietitian for more information. Summary  Iron is a mineral that helps your body to produce hemoglobin. Hemoglobin is a protein in red blood cells that carries oxygen to your body's tissues.  Iron is naturally found in many foods, and many foods have iron added to them (iron-fortified foods).  When you eat foods that contain iron, you should eat them with foods that are high in vitamin C. Vitamin C helps your body to absorb iron.  Certain foods and drinks prevent your body from absorbing iron properly, such as whole grains and dairy products. You should avoid eating these foods in the same meal as iron-rich foods or with iron supplements. This information is not  intended to replace advice given to you by your health care provider. Make sure you discuss any questions you have with your health care provider. Document Revised: 08/22/2017 Document Reviewed: 08/05/2017 Elsevier Patient Education  2021 [employer].

## 2020-11-21 NOTE — Progress Notes (Signed)
   PRENATAL VISIT NOTE  Subjective:  Laura [name] is a 35 y.o. G1P0000 at [redacted]w[redacted]d being seen today for ongoing prenatal care.  She is currently monitored for the following issues for this high-risk pregnancy and has Von Willebrand's disease ([place]); Melanoma of skin ([place]); GAD (generalized anxiety disorder); Supervision of high risk pregnancy, antepartum; and AMA (advanced maternal age) multigravida 35+ on their problem list.  Patient reports no complaints.  Contractions: Not present. Vag. Bleeding: None.   . Denies leaking of fluid.   The following portions of the patient's history were reviewed and updated as appropriate: allergies, current medications, past family history, past medical history, past social history, past surgical history and problem list.   Objective:   Vitals:   11/21/20 0839  BP: 113/71  Pulse: 99  Weight: 128 lb (58.1 kg)    Fetal Status: Fetal Heart Rate (bpm): 146         General:  Alert, oriented and cooperative. Patient is in no acute distress.  Skin: Skin is warm and dry. No rash noted.   Cardiovascular: Normal heart rate noted  Respiratory: Normal respiratory effort, no problems with respiration noted  Abdomen: Soft, gravid, appropriate for gestational age.  Pain/Pressure: Absent     Pelvic: Cervical exam deferred        Extremities: Normal range of motion.  Edema: None  Mental Status: Normal mood and affect. Normal behavior. Normal judgment and thought content.   Assessment and Plan:  Pregnancy: G1P0000 at [redacted]w[redacted]d 1. Von Willebrand's disease ([place]) Normal labs on 11/08/2020. Will follow up with Heme/Onc in July 2022 as scheduled.  She was told to have them delineate any instructions about what to do during labor and delivery and postpartum if needed.  2. Anemia of pregnancy in second trimester Hemoglobin on 11/08/2020 was 10/9. Advised iron supplementation, she wants to do this with diet. Given information on how to have an iron rich diet. Will recheck  later in pregnancy and determine need for supplementation.  3. Multigravida of advanced maternal age in second trimester Low risk NIPS.  AFP to be done next visit.  Anatomy scan already scheduled.  4. Supervision of high risk pregnancy, antepartum No other complaints or concerns.  Routine obstetric precautions reviewed.  Please refer to After Visit Summary for other counseling recommendations.   Return in about 4 weeks (around 12/19/2020) for AFP lab if desired, OFFICE OB VISIT (MD or APP).  Future Appointments  Date Time Provider [place]  12/20/2020  3:10 PM Starr Lake, CNM [facility]  12/21/2020  9:00 AM [facility]  01/16/2021  3:15 PM Anyanwu, Sallyanne Havers, MD [facility]  04/05/2021 11:00 AM [facility] None  04/05/2021 11:30 AM Orson Slick, MD [facility] None    Verita Schneiders, MD

## 2020-11-29 ENCOUNTER — Encounter: Payer: Self-pay | Admitting: Hematology and Oncology

## 2020-11-29 NOTE — Telephone Encounter (Signed)
TCT to patient's mother at patient's request regarding her labs.  Advised that her daughter's Von Willebrand results have come back normal. His recommendation is to return to this clinic approx 1 month before her delivery date and then again 1 month after delivery.  Her mom voiced understanding. She is aware of pt's appt in July 2022.

## 2020-12-11 ENCOUNTER — Other Ambulatory Visit: Payer: Self-pay | Admitting: Family Medicine

## 2020-12-11 DIAGNOSIS — O0990 Supervision of high risk pregnancy, unspecified, unspecified trimester: Secondary | ICD-10-CM

## 2020-12-11 DIAGNOSIS — O09522 Supervision of elderly multigravida, second trimester: Secondary | ICD-10-CM

## 2020-12-11 LAB — VON WILLEBRAND FACTOR MULTIMER

## 2020-12-20 ENCOUNTER — Other Ambulatory Visit: Payer: Self-pay

## 2020-12-20 ENCOUNTER — Ambulatory Visit (INDEPENDENT_AMBULATORY_CARE_PROVIDER_SITE_OTHER): Payer: BC Managed Care – PPO | Admitting: Student

## 2020-12-20 VITALS — BP 108/70 | HR 83 | Wt 131.0 lb

## 2020-12-20 DIAGNOSIS — O0990 Supervision of high risk pregnancy, unspecified, unspecified trimester: Secondary | ICD-10-CM

## 2020-12-20 DIAGNOSIS — Z3A18 18 weeks gestation of pregnancy: Secondary | ICD-10-CM

## 2020-12-20 NOTE — Progress Notes (Signed)
   PRENATAL VISIT NOTE  Subjective:  Laura [name] is a 35 y.o. G1P0000 at [redacted]w[redacted]d being seen today for ongoing prenatal care.  She is currently monitored for the following issues for this low-risk pregnancy and has Von Willebrand's disease ([place]); Melanoma of skin ([place]); GAD (generalized anxiety disorder); Supervision of high risk pregnancy, antepartum; and AMA (advanced maternal age) multigravida 35+ on their problem list.  Patient reports no complaints. Reports that hematologist on 2/16 says that levels are WNL, no further work-up until July. .  Contractions: Not present. Vag. Bleeding: None.  Movement: Present. Denies leaking of fluid.   The following portions of the patient's history were reviewed and updated as appropriate: allergies, current medications, past family history, past medical history, past social history, past surgical history and problem list.   Objective:   Vitals:   12/20/20 1503  BP: 108/70  Pulse: 83  Weight: 131 lb (59.4 kg)    Fetal Status: Fetal Heart Rate (bpm): 147   Movement: Present     General:  Alert, oriented and cooperative. Patient is in no acute distress.  Skin: Skin is warm and dry. No rash noted.   Cardiovascular: Normal heart rate noted  Respiratory: Normal respiratory effort, no problems with respiration noted  Abdomen: Soft, gravid, appropriate for gestational age.  Pain/Pressure: Absent     Pelvic: Cervical exam deferred        Extremities: Normal range of motion.  Edema: None  Mental Status: Normal mood and affect. Normal behavior. Normal judgment and thought content.   Assessment and Plan:  Pregnancy: G1P0000 at [redacted]w[redacted]d 1. Supervision of high risk pregnancy, antepartum -discussed family planning, patient would like one more child and does not want a LARC. Did not like how the pill made her feel.  -keep follow-up appt in July for [facility] to check levels at least one month before delivery; monitor for signs and symptoms of bleeding -Keep [place] appt  tomorrow.  -AFP today  Preterm labor symptoms and general obstetric precautions including but not limited to vaginal bleeding, contractions, leaking of fluid and fetal movement were reviewed in detail with the patient. Please refer to After Visit Summary for other counseling recommendations.   Return in about 4 weeks (around 01/17/2021), or LROB on My Chart in 4 weeks.  Future Appointments  Date Time Provider [place]  12/21/2020  9:00 AM [facility]  01/16/2021  3:15 PM Anyanwu, Sallyanne Havers, MD [facility]  01/17/2021  3:30 PM Starr Lake, CNM [facility]  04/05/2021 11:00 AM [facility] None  04/05/2021 11:30 AM Orson Slick, MD [facility] None    Starr Lake, CNM

## 2020-12-21 ENCOUNTER — Other Ambulatory Visit: Payer: Self-pay

## 2020-12-21 ENCOUNTER — Ambulatory Visit: Payer: BC Managed Care – PPO | Attending: Obstetrics

## 2020-12-21 DIAGNOSIS — O09522 Supervision of elderly multigravida, second trimester: Secondary | ICD-10-CM | POA: Diagnosis not present

## 2020-12-21 DIAGNOSIS — Z3A19 19 weeks gestation of pregnancy: Secondary | ICD-10-CM | POA: Diagnosis not present

## 2020-12-21 DIAGNOSIS — O99012 Anemia complicating pregnancy, second trimester: Secondary | ICD-10-CM | POA: Insufficient documentation

## 2020-12-21 DIAGNOSIS — O09512 Supervision of elderly primigravida, second trimester: Secondary | ICD-10-CM | POA: Diagnosis present

## 2020-12-21 DIAGNOSIS — O321XX0 Maternal care for breech presentation, not applicable or unspecified: Secondary | ICD-10-CM

## 2020-12-21 DIAGNOSIS — O0990 Supervision of high risk pregnancy, unspecified, unspecified trimester: Secondary | ICD-10-CM

## 2020-12-21 DIAGNOSIS — D680 Von Willebrand's disease: Secondary | ICD-10-CM | POA: Diagnosis not present

## 2020-12-21 LAB — AFP, SERUM, OPEN SPINA BIFIDA
AFP MoM: 0.74
AFP Value: 40.2 ng/mL
Gest. Age on Collection Date: 18.6 weeks
Maternal Age At EDD: 36.5 yr
OSBR Risk 1 IN: 10000
Test Results:: NEGATIVE
Weight: 131 [lb_av]

## 2021-01-04 ENCOUNTER — Other Ambulatory Visit: Payer: Self-pay | Admitting: Family Medicine

## 2021-01-04 DIAGNOSIS — O99012 Anemia complicating pregnancy, second trimester: Secondary | ICD-10-CM

## 2021-01-04 DIAGNOSIS — O09522 Supervision of elderly multigravida, second trimester: Secondary | ICD-10-CM

## 2021-01-16 ENCOUNTER — Encounter: Payer: BC Managed Care – PPO | Admitting: Obstetrics & Gynecology

## 2021-01-17 ENCOUNTER — Telehealth (INDEPENDENT_AMBULATORY_CARE_PROVIDER_SITE_OTHER): Payer: BC Managed Care – PPO | Admitting: Student

## 2021-01-17 ENCOUNTER — Other Ambulatory Visit: Payer: Self-pay

## 2021-01-17 VITALS — BP 107/57

## 2021-01-17 DIAGNOSIS — O99112 Other diseases of the blood and blood-forming organs and certain disorders involving the immune mechanism complicating pregnancy, second trimester: Secondary | ICD-10-CM

## 2021-01-17 DIAGNOSIS — D680 Von Willebrand's disease: Secondary | ICD-10-CM

## 2021-01-17 DIAGNOSIS — O0990 Supervision of high risk pregnancy, unspecified, unspecified trimester: Secondary | ICD-10-CM

## 2021-01-17 DIAGNOSIS — F419 Anxiety disorder, unspecified: Secondary | ICD-10-CM

## 2021-01-17 DIAGNOSIS — Z3A22 22 weeks gestation of pregnancy: Secondary | ICD-10-CM

## 2021-01-17 DIAGNOSIS — O99342 Other mental disorders complicating pregnancy, second trimester: Secondary | ICD-10-CM

## 2021-01-17 DIAGNOSIS — O09522 Supervision of elderly multigravida, second trimester: Secondary | ICD-10-CM

## 2021-01-17 NOTE — Patient Instructions (Signed)
Oral Glucose Tolerance Test During Pregnancy Why am I having this test? The oral glucose tolerance test (OGTT) is done to check how your body processes blood sugar (glucose). This is one of several tests used to diagnose diabetes that develops during pregnancy (gestational diabetes mellitus). Gestational diabetes is a short-term form of diabetes that some women develop while they are pregnant. It usually occurs during the second trimester of pregnancy and goes away after delivery. Testing, or screening, for gestational diabetes usually occurs at weeks 24-28 of pregnancy. You may have the OGTT test after having a 1-hour glucose screening test if the results from that test indicate that you may have gestational diabetes. This test may also be needed if:  You have a history of gestational diabetes.  There is a history of giving birth to very large babies or of losing pregnancies (having stillbirths).  You have signs and symptoms of diabetes, such as: ? Changes in your eyesight. ? Tingling or numbness in your hands or feet. ? Changes in hunger, thirst, and urination, and these are not explained by your pregnancy. What is being tested? This test measures the amount of glucose in your blood at different times during a period of 3 hours. This shows how well your body can process glucose. What kind of sample is taken? Blood samples are required for this test. They are usually collected by inserting a needle into a blood vessel.   How do I prepare for this test?  For 3 days before your test, eat normally. Have plenty of carbohydrate-rich foods.  Follow instructions from your health care provider about: ? Eating or drinking restrictions on the day of the test. You may be asked not to eat or drink anything other than water (to fast) starting 8-10 hours before the test. ? Changing or stopping your regular medicines. Some medicines may interfere with this test. Tell a health care provider about:  All  medicines you are taking, including vitamins, herbs, eye drops, creams, and over-the-counter medicines.  Any blood disorders you have.  Any surgeries you have had.  Any medical conditions you have. What happens during the test? First, your blood glucose will be measured. This is referred to as your fasting blood glucose because you fasted before the test. Then, you will drink a glucose solution that contains a certain amount of glucose. Your blood glucose will be measured again 1, 2, and 3 hours after you drink the solution. This test takes about 3 hours to complete. You will need to stay at the testing location during this time. During the testing period:  Do not eat or drink anything other than the glucose solution.  Do not exercise.  Do not use any products that contain nicotine or tobacco, such as cigarettes, e-cigarettes, and chewing tobacco. These can affect your test results. If you need help quitting, ask your health care provider. The testing procedure may vary among health care providers and hospitals. How are the results reported? Your results will be reported as milligrams of glucose per deciliter of blood (mg/dL) or millimoles per liter (mmol/L). There is more than one source for screening and diagnosis reference values used to diagnose gestational diabetes. Your health care provider will compare your results to normal values that were established after testing a large group of people (reference values). Reference values may vary among labs and hospitals. For this test ([place]), reference values are:  Fasting: 95 mg/dL (5.3 mmol/L).  1 hour: 180 mg/dL (10.0 mmol/L).  2 hour:   155 mg/dL (8.6 mmol/L).  3 hour: 140 mg/dL (7.8 mmol/L). What do the results mean? Results below the reference values are considered normal. If two or more of your blood glucose levels are at or above the reference values, you may be diagnosed with gestational diabetes. If only one level is  high, your health care provider may suggest repeat testing or other tests to confirm a diagnosis. Talk with your health care provider about what your results mean. Questions to ask your health care provider Ask your health care provider, or the department that is doing the test:  When will my results be ready?  How will I get my results?  What are my treatment options?  What other tests do I need?  What are my next steps? Summary  The oral glucose tolerance test (OGTT) is one of several tests used to diagnose diabetes that develops during pregnancy (gestational diabetes mellitus). Gestational diabetes is a short-term form of diabetes that some women develop while they are pregnant.  You may have the OGTT test after having a 1-hour glucose screening test if the results from that test show that you may have gestational diabetes. You may also have this test if you have any symptoms or risk factors for this type of diabetes.  Talk with your health care provider about what your results mean. This information is not intended to replace advice given to you by your health care provider. Make sure you discuss any questions you have with your health care provider. Document Revised: 02/17/2020 Document Reviewed: 02/17/2020 Elsevier Patient Education  2021 Elsevier Inc.  

## 2021-01-17 NOTE — Progress Notes (Signed)
I connected with Laura [name] 01/17/21 at  3:30 PM EDT by: MyChart video and verified that I am speaking with the correct person using two identifiers.  Patient is located at home and provider is located at [facility].    The purpose of this virtual visit is to provide medical care while limiting exposure to the novel coronavirus. I discussed the limitations, risks, security and privacy concerns of performing an evaluation and management service by MyChart video and the availability of in person appointments. I also discussed with the patient that there may be a patient responsible charge related to this service. By engaging in this virtual visit, you consent to the provision of healthcare.  Additionally, you authorize for your insurance to be billed for the services provided during this visit.  The patient expressed understanding and agreed to proceed.  The following staff members participated in the virtual visit:  LR    PRENATAL VISIT NOTE  Subjective:  Laura [name] is a 35 y.o. G1P0000 at [redacted]w[redacted]d  for phone visit for ongoing prenatal care.  She is currently monitored for the following issues for this low-risk pregnancy and has Von Willebrand's disease ([place]); Melanoma of skin ([place]); GAD (generalized anxiety disorder); Supervision of high risk pregnancy, antepartum; and AMA (advanced maternal age) multigravida 35+ on their problem list.  Patient reports no complaints.  Contractions: Not present. Vag. Bleeding: None.  Movement: Present. Denies leaking of fluid.   The following portions of the patient's history were reviewed and updated as appropriate: allergies, current medications, past family history, past medical history, past social history, past surgical history and problem list.   Objective:   Vitals:   01/17/21 1535  BP: (!) 107/57   Self-Obtained  Fetal Status:     Movement: Present     Assessment and Plan:  Pregnancy: G1P0000 at [redacted]w[redacted]d 1. Supervision of high risk pregnancy,  antepartum -doing well, no complaints -keep follow up appt tomorrow for [place]  -Not sure about BC yet, may want more kids in the near future  -anticipatory guidance for upcoming 28 week appt and blood draws -had appt with hematologist in Feb, no change for now. Patient is not noticing any extra bleeding/bruising. She has appt for hematology in July, at that point patient can make a delivery plan.   Preterm labor symptoms and general obstetric precautions including but not limited to vaginal bleeding, contractions, leaking of fluid and fetal movement were reviewed in detail with the patient.  Return in about 6 weeks (around 02/28/2021), or two hour GTT and provider appt.  Future Appointments  Date Time Provider [place]  01/18/2021  8:00 AM [facility]  02/28/2021  8:30 AM Rasch, Artist Pais, NP [facility]  04/05/2021 11:00 AM [facility] None  04/05/2021 11:30 AM Orson Slick, MD [facility] None     Time spent on virtual visit: 12 minutes  Starr Lake, CNM

## 2021-01-18 ENCOUNTER — Other Ambulatory Visit: Payer: Self-pay

## 2021-01-18 ENCOUNTER — Ambulatory Visit: Payer: BC Managed Care – PPO | Attending: Maternal & Fetal Medicine

## 2021-01-18 DIAGNOSIS — O99112 Other diseases of the blood and blood-forming organs and certain disorders involving the immune mechanism complicating pregnancy, second trimester: Secondary | ICD-10-CM | POA: Insufficient documentation

## 2021-01-18 DIAGNOSIS — D680 Von Willebrand's disease: Secondary | ICD-10-CM | POA: Insufficient documentation

## 2021-01-18 DIAGNOSIS — O09522 Supervision of elderly multigravida, second trimester: Secondary | ICD-10-CM | POA: Diagnosis not present

## 2021-01-18 DIAGNOSIS — Z3A23 23 weeks gestation of pregnancy: Secondary | ICD-10-CM | POA: Diagnosis not present

## 2021-01-18 DIAGNOSIS — O99012 Anemia complicating pregnancy, second trimester: Secondary | ICD-10-CM | POA: Insufficient documentation

## 2021-02-05 ENCOUNTER — Other Ambulatory Visit: Payer: Self-pay | Admitting: Internal Medicine

## 2021-02-14 ENCOUNTER — Encounter: Payer: BC Managed Care – PPO | Admitting: Advanced Practice Midwife

## 2021-02-14 MED ORDER — ESCITALOPRAM OXALATE 10 MG PO TABS
1.0000 | ORAL_TABLET | Freq: Every day | ORAL | 1 refills | Status: DC
Start: 2021-02-14 — End: 2021-08-09

## 2021-02-28 ENCOUNTER — Encounter: Payer: BC Managed Care – PPO | Admitting: Obstetrics and Gynecology

## 2021-03-01 ENCOUNTER — Other Ambulatory Visit: Payer: Self-pay

## 2021-03-01 ENCOUNTER — Ambulatory Visit (INDEPENDENT_AMBULATORY_CARE_PROVIDER_SITE_OTHER): Payer: BC Managed Care – PPO | Admitting: Family Medicine

## 2021-03-01 VITALS — BP 111/69 | HR 93 | Wt 139.0 lb

## 2021-03-01 DIAGNOSIS — D6800 Von Willebrand disease, unspecified: Secondary | ICD-10-CM

## 2021-03-01 DIAGNOSIS — Z23 Encounter for immunization: Secondary | ICD-10-CM

## 2021-03-01 DIAGNOSIS — O09523 Supervision of elderly multigravida, third trimester: Secondary | ICD-10-CM

## 2021-03-01 DIAGNOSIS — O0990 Supervision of high risk pregnancy, unspecified, unspecified trimester: Secondary | ICD-10-CM

## 2021-03-01 NOTE — Patient Instructions (Signed)

## 2021-03-01 NOTE — Progress Notes (Signed)
   PRENATAL VISIT NOTE  Subjective:  Laura [name] is a 35 y.o. G1P0000 at [redacted]w[redacted]d being seen today for ongoing prenatal care.  She is currently monitored for the following issues for this high-risk pregnancy and has Von Willebrand's disease ([place]); Melanoma of skin ([place]); GAD (generalized anxiety disorder); Supervision of high risk pregnancy, antepartum; and AMA (advanced maternal age) multigravida 35+ on their problem list.  Patient reports no complaints.  Contractions: Not present. Vag. Bleeding: None.  Movement: Present. Denies leaking of fluid.   The following portions of the patient's history were reviewed and updated as appropriate: allergies, current medications, past family history, past medical history, past social history, past surgical history and problem list.   Objective:   Vitals:   03/01/21 0847  BP: 111/69  Pulse: 93  Weight: 139 lb (63 kg)    Fetal Status: Fetal Heart Rate (bpm): 142 Fundal Height: 28 cm Movement: Present     General:  Alert, oriented and cooperative. Patient is in no acute distress.  Skin: Skin is warm and dry. No rash noted.   Cardiovascular: Normal heart rate noted  Respiratory: Normal respiratory effort, no problems with respiration noted  Abdomen: Soft, gravid, appropriate for gestational age.  Pain/Pressure: Absent     Pelvic: Cervical exam deferred        Extremities: Normal range of motion.  Edema: None  Mental Status: Normal mood and affect. Normal behavior. Normal judgment and thought content.   Assessment and Plan:  Pregnancy: G1P0000 at [redacted]w[redacted]d 1. Supervision of high risk pregnancy, antepartum 28 wk labs today - Glucose Tolerance, 2 Hours w/1 Hour - CBC - RPR - HIV Antibody (routine testing w rflx)  2. Von Willebrand's disease [facility]) Discussed via chat with heme/onc--she will f/u with them closer to term Would consider DDAVP at delivery and after for 7 days and would need Stimate to pharmacy prior to delivery  3. Multigravida of  advanced maternal age in third trimester Normal NIPT  Preterm labor symptoms and general obstetric precautions including but not limited to vaginal bleeding, contractions, leaking of fluid and fetal movement were reviewed in detail with the patient. Please refer to After Visit Summary for other counseling recommendations.   Return in 2 weeks (on 03/15/2021).  Future Appointments  Date Time Provider [place]  03/15/2021  8:45 AM Aletha Halim, MD [facility]  04/05/2021 11:00 AM [facility] None  04/05/2021 11:30 AM Orson Slick, MD [facility] None    Donnamae Jude, MD

## 2021-03-02 ENCOUNTER — Telehealth: Payer: Self-pay | Admitting: *Deleted

## 2021-03-02 LAB — HIV ANTIBODY (ROUTINE TESTING W REFLEX): HIV Screen 4th Generation wRfx: NONREACTIVE

## 2021-03-02 LAB — CBC
Hematocrit: 29.7 % — ABNORMAL LOW (ref 34.0–46.6)
Hemoglobin: 9.8 g/dL — ABNORMAL LOW (ref 11.1–15.9)
MCH: 30.7 pg (ref 26.6–33.0)
MCHC: 33 g/dL (ref 31.5–35.7)
MCV: 93 fL (ref 79–97)
Platelets: 316 10*3/uL (ref 150–450)
RBC: 3.19 x10E6/uL — ABNORMAL LOW (ref 3.77–5.28)
RDW: 11.9 % (ref 11.7–15.4)
WBC: 11.2 10*3/uL — ABNORMAL HIGH (ref 3.4–10.8)

## 2021-03-02 LAB — GLUCOSE TOLERANCE, 2 HOURS W/ 1HR
Glucose, 1 hour: 146 mg/dL (ref 65–179)
Glucose, 2 hour: 134 mg/dL (ref 65–152)
Glucose, Fasting: 75 mg/dL (ref 65–91)

## 2021-03-02 LAB — RPR: RPR Ser Ql: NONREACTIVE

## 2021-03-02 MED ORDER — FERROUS SULFATE 325 (65 FE) MG PO TABS: 325.0000 mg | ORAL_TABLET | ORAL | 1 refills | Status: DC

## 2021-03-02 NOTE — Telephone Encounter (Signed)
Faxed completed FMLA forms to [phone] for husband for pt delivery and recovery.

## 2021-03-02 NOTE — Addendum Note (Signed)
Addended by: Donnamae Jude on: 03/02/2021 08:49 AM   Modules accepted: Orders

## 2021-03-15 ENCOUNTER — Other Ambulatory Visit: Payer: Self-pay

## 2021-03-15 ENCOUNTER — Ambulatory Visit (INDEPENDENT_AMBULATORY_CARE_PROVIDER_SITE_OTHER): Payer: BC Managed Care – PPO | Admitting: Obstetrics and Gynecology

## 2021-03-15 VITALS — BP 117/68 | HR 116 | Wt 140.0 lb

## 2021-03-15 DIAGNOSIS — O09523 Supervision of elderly multigravida, third trimester: Secondary | ICD-10-CM

## 2021-03-15 DIAGNOSIS — O0990 Supervision of high risk pregnancy, unspecified, unspecified trimester: Secondary | ICD-10-CM

## 2021-03-15 DIAGNOSIS — C439 Malignant melanoma of skin, unspecified: Secondary | ICD-10-CM

## 2021-03-15 DIAGNOSIS — Z3A31 31 weeks gestation of pregnancy: Secondary | ICD-10-CM

## 2021-03-15 DIAGNOSIS — O99013 Anemia complicating pregnancy, third trimester: Secondary | ICD-10-CM | POA: Insufficient documentation

## 2021-03-15 NOTE — Progress Notes (Signed)
    PRENATAL VISIT NOTE  Subjective:  Laura [name] is a 35 y.o. G1P0000 at [redacted]w[redacted]d being seen today for ongoing prenatal care.  She is currently monitored for the following issues for this high-risk pregnancy and has Von Willebrand's disease ([place]); Melanoma of skin ([place]); GAD (generalized anxiety disorder); Supervision of high risk pregnancy, antepartum; AMA (advanced maternal age) multigravida 35+; and Anemia during pregnancy in third trimester on their problem list.  Patient reports no complaints.  Contractions: Not present. Vag. Bleeding: None.  Movement: Present. Denies leaking of fluid.   The following portions of the patient's history were reviewed and updated as appropriate: allergies, current medications, past family history, past medical history, past social history, past surgical history and problem list.   Objective:   Vitals:   03/15/21 0853  BP: 117/68  Pulse: (!) 116  Weight: 140 lb (63.5 kg)    Fetal Status: Fetal Heart Rate (bpm): 142 Fundal Height: 31 cm Movement: Present     General:  Alert, oriented and cooperative. Patient is in no acute distress.  Skin: Skin is warm and dry. No rash noted.   Cardiovascular: Normal heart rate noted  Respiratory: Normal respiratory effort, no problems with respiration noted  Abdomen: Soft, gravid, appropriate for gestational age.  Pain/Pressure: Absent     Pelvic: Cervical exam deferred        Extremities: Normal range of motion.  Edema: None  Mental Status: Normal mood and affect. Normal behavior. Normal judgment and thought content.   Assessment and Plan:  Pregnancy: G1P0000 at [redacted]w[redacted]d 1. Supervision of high risk pregnancy, antepartum  2. Melanoma of skin ([place]) Send placenta to pathology  3. [redacted] weeks gestation of pregnancy  4. Multigravida of advanced maternal age in third trimester  5. Anemia during pregnancy in third trimester Confirms on PO iron. I told her I recommend an anemia profile nv and if still low then  consider IV iron given risk of bleeding - Anemia Profile B; Future  6. vWD Has 7/14 heme f/u visit. Normal labs mid February 2022  Preterm labor symptoms and general obstetric precautions including but not limited to vaginal bleeding, contractions, leaking of fluid and fetal movement were reviewed in detail with the patient. Please refer to After Visit Summary for other counseling recommendations.   Return in about 2 weeks (around 03/29/2021) for in person, md visit, high risk ob.  Future Appointments  Date Time Provider [place]  04/05/2021 11:00 AM [facility] None  04/05/2021 11:30 AM Orson Slick, MD [facility] None    Aletha Halim, MD

## 2021-04-02 ENCOUNTER — Ambulatory Visit (INDEPENDENT_AMBULATORY_CARE_PROVIDER_SITE_OTHER): Payer: BC Managed Care – PPO | Admitting: Obstetrics and Gynecology

## 2021-04-02 ENCOUNTER — Other Ambulatory Visit: Payer: Self-pay

## 2021-04-02 VITALS — BP 118/72 | HR 99 | Wt 143.0 lb

## 2021-04-02 DIAGNOSIS — Z3A33 33 weeks gestation of pregnancy: Secondary | ICD-10-CM

## 2021-04-02 DIAGNOSIS — K219 Gastro-esophageal reflux disease without esophagitis: Secondary | ICD-10-CM | POA: Insufficient documentation

## 2021-04-02 DIAGNOSIS — O09523 Supervision of elderly multigravida, third trimester: Secondary | ICD-10-CM

## 2021-04-02 DIAGNOSIS — O99013 Anemia complicating pregnancy, third trimester: Secondary | ICD-10-CM

## 2021-04-02 DIAGNOSIS — O0990 Supervision of high risk pregnancy, unspecified, unspecified trimester: Secondary | ICD-10-CM

## 2021-04-02 DIAGNOSIS — D6800 Von Willebrand disease, unspecified: Secondary | ICD-10-CM

## 2021-04-02 DIAGNOSIS — C439 Malignant melanoma of skin, unspecified: Secondary | ICD-10-CM

## 2021-04-02 HISTORY — DX: Gastro-esophageal reflux disease without esophagitis: K21.9

## 2021-04-02 LAB — CBC
Hematocrit: 28.7 % — ABNORMAL LOW (ref 34.0–46.6)
Hemoglobin: 9.7 g/dL — ABNORMAL LOW (ref 11.1–15.9)
MCH: 30.3 pg (ref 26.6–33.0)
MCHC: 33.8 g/dL (ref 31.5–35.7)
MCV: 90 fL (ref 79–97)
Platelets: 322 10*3/uL (ref 150–450)
RBC: 3.2 x10E6/uL — ABNORMAL LOW (ref 3.77–5.28)
RDW: 12.5 % (ref 11.7–15.4)
WBC: 11.6 10*3/uL — ABNORMAL HIGH (ref 3.4–10.8)

## 2021-04-02 MED ORDER — PANTOPRAZOLE SODIUM 20 MG PO TBEC: 20.0000 mg | DELAYED_RELEASE_TABLET | Freq: Every day | ORAL | 1 refills | Status: DC

## 2021-04-03 LAB — ANEMIA PROFILE B
Basophils Absolute: 0 10*3/uL (ref 0.0–0.2)
Basos: 0 %
EOS (ABSOLUTE): 0.1 10*3/uL (ref 0.0–0.4)
Eos: 0 %
Ferritin: 8 ng/mL — ABNORMAL LOW (ref 15–150)
Folate: 17.8 ng/mL (ref >3.0)
Hematocrit: 29 % — ABNORMAL LOW (ref 34.0–46.6)
Hemoglobin: 9.7 g/dL — ABNORMAL LOW (ref 11.1–15.9)
Immature Grans (Abs): 0.6 10*3/uL — ABNORMAL HIGH (ref 0.0–0.1)
Immature Granulocytes: 5 %
Iron Saturation: 6 % — CL (ref 15–55)
Iron: 35 ug/dL (ref 27–159)
Lymphocytes Absolute: 2 10*3/uL (ref 0.7–3.1)
Lymphs: 18 %
MCH: 30 pg (ref 26.6–33.0)
MCHC: 33.4 g/dL (ref 31.5–35.7)
MCV: 90 fL (ref 79–97)
Monocytes Absolute: 0.8 10*3/uL (ref 0.1–0.9)
Monocytes: 7 %
Neutrophils Absolute: 8 10*3/uL — ABNORMAL HIGH (ref 1.4–7.0)
Neutrophils: 70 %
Platelets: 323 10*3/uL (ref 150–450)
RBC: 3.23 x10E6/uL — ABNORMAL LOW (ref 3.77–5.28)
RDW: 12.5 % (ref 11.7–15.4)
Retic Ct Pct: 2.2 % (ref 0.6–2.6)
Total Iron Binding Capacity: 540 ug/dL — ABNORMAL HIGH (ref 250–450)
UIBC: 505 ug/dL — ABNORMAL HIGH (ref 131–425)
Vitamin B-12: 376 pg/mL (ref 232–1245)
WBC: 11.4 10*3/uL — ABNORMAL HIGH (ref 3.4–10.8)

## 2021-04-03 NOTE — Progress Notes (Signed)
   PRENATAL VISIT NOTE  Subjective:  Laura [name] is a 35 y.o. G1P0000 at [redacted]w[redacted]d being seen today for ongoing prenatal care.  She is currently monitored for the following issues for this high-risk pregnancy and has Von Willebrand's disease ([place]); Melanoma of skin ([place]); GAD (generalized anxiety disorder); Supervision of high risk pregnancy, antepartum; AMA (advanced maternal age) multigravida 84+; Anemia during pregnancy in third trimester; and Gastroesophageal reflux disease without esophagitis on their problem list.  Patient reports  GERD .  Contractions: Not present. Vag. Bleeding: None.  Movement: Present. Denies leaking of fluid.   The following portions of the patient's history were reviewed and updated as appropriate: allergies, current medications, past family history, past medical history, past social history, past surgical history and problem list.   Objective:   Vitals:   04/02/21 1448  BP: 118/72  Pulse: 99  Weight: 143 lb (64.9 kg)    Fetal Status: Fetal Heart Rate (bpm): 156   Movement: Present     General:  Alert, oriented and cooperative. Patient is in no acute distress.  Skin: Skin is warm and dry. No rash noted.   Cardiovascular: Normal heart rate noted  Respiratory: Normal respiratory effort, no problems with respiration noted  Abdomen: Soft, gravid, appropriate for gestational age.  Pain/Pressure: Present     Pelvic: Cervical exam deferred        Extremities: Normal range of motion.     Mental Status: Normal mood and affect. Normal behavior. Normal judgment and thought content.   Assessment and Plan:  Pregnancy: G1P0000 at [redacted]w[redacted]d 1. Gastroesophageal reflux disease without esophagitis Will try protonix. Pt has tried pepcid  2. [redacted] weeks gestation of pregnancy - CBC  3. Multigravida of advanced maternal age in third trimester No issues  4. Anemia during pregnancy in third trimester - Anemia Profile B  5. Supervision of high risk pregnancy,  antepartum  6. Melanoma of skin ([place]) Placenta to pathology  7. Von Willebrand's disease ([place]) F/u 7/14 heme visit. I d/w her that based on this visit that a 39wk scheduled IOL may be best  Preterm labor symptoms and general obstetric precautions including but not limited to vaginal bleeding, contractions, leaking of fluid and fetal movement were reviewed in detail with the patient. Please refer to After Visit Summary for other counseling recommendations.   Return in about 2 weeks (around 04/16/2021) for md visit, in person, high risk ob.  Future Appointments  Date Time Provider [place]  04/05/2021 11:00 AM [facility] None  04/05/2021 11:40 AM Orson Slick, MD [facility] None  04/23/2021  2:10 PM Anyanwu, Sallyanne Havers, MD [facility]  04/30/2021  1:30 PM Donnamae Jude, MD [facility]  05/07/2021  1:10 PM Donnamae Jude, MD [facility]  05/14/2021  1:10 PM Aletha Halim, MD [facility]    Aletha Halim, MD

## 2021-04-04 ENCOUNTER — Telehealth: Payer: Self-pay

## 2021-04-04 ENCOUNTER — Other Ambulatory Visit: Payer: Self-pay | Admitting: Obstetrics and Gynecology

## 2021-04-04 NOTE — Telephone Encounter (Signed)
Pt made aware of iron infusion scheduled at [facility] on Monday, July 18th at [place]

## 2021-04-05 ENCOUNTER — Other Ambulatory Visit: Payer: Self-pay

## 2021-04-05 ENCOUNTER — Inpatient Hospital Stay: Payer: BC Managed Care – PPO | Admitting: Hematology and Oncology

## 2021-04-05 ENCOUNTER — Inpatient Hospital Stay: Payer: BC Managed Care – PPO | Attending: Hematology and Oncology

## 2021-04-05 ENCOUNTER — Other Ambulatory Visit: Payer: Self-pay | Admitting: Hematology and Oncology

## 2021-04-05 VITALS — BP 138/78 | HR 102 | Temp 98.5°F | Resp 17 | Ht 62.0 in | Wt 143.7 lb

## 2021-04-05 DIAGNOSIS — D6800 Von Willebrand disease, unspecified: Secondary | ICD-10-CM

## 2021-04-05 DIAGNOSIS — Z79899 Other long term (current) drug therapy: Secondary | ICD-10-CM | POA: Diagnosis not present

## 2021-04-05 DIAGNOSIS — D508 Other iron deficiency anemias: Secondary | ICD-10-CM | POA: Insufficient documentation

## 2021-04-05 DIAGNOSIS — Z85820 Personal history of malignant melanoma of skin: Secondary | ICD-10-CM | POA: Diagnosis not present

## 2021-04-05 LAB — CBC WITH DIFFERENTIAL (CANCER CENTER ONLY)
Abs Immature Granulocytes: 0.54 10*3/uL — ABNORMAL HIGH (ref 0.00–0.07)
Basophils Absolute: 0 10*3/uL (ref 0.0–0.1)
Basophils Relative: 0 %
Eosinophils Absolute: 0.1 10*3/uL (ref 0.0–0.5)
Eosinophils Relative: 1 %
HCT: 30 % — ABNORMAL LOW (ref 36.0–46.0)
Hemoglobin: 9.8 g/dL — ABNORMAL LOW (ref 12.0–15.0)
Immature Granulocytes: 5 %
Lymphocytes Relative: 18 %
Lymphs Abs: 1.9 10*3/uL (ref 0.7–4.0)
MCH: 30.3 pg (ref 26.0–34.0)
MCHC: 32.7 g/dL (ref 30.0–36.0)
MCV: 92.9 fL (ref 80.0–100.0)
Monocytes Absolute: 0.5 10*3/uL (ref 0.1–1.0)
Monocytes Relative: 5 %
Neutro Abs: 7.6 10*3/uL (ref 1.7–7.7)
Neutrophils Relative %: 71 %
Platelet Count: 293 10*3/uL (ref 150–400)
RBC: 3.23 MIL/uL — ABNORMAL LOW (ref 3.87–5.11)
RDW: 13.5 % (ref 11.5–15.5)
WBC Count: 10.7 10*3/uL — ABNORMAL HIGH (ref 4.0–10.5)
nRBC: 0 % (ref 0.0–0.2)

## 2021-04-05 LAB — CMP (CANCER CENTER ONLY)
ALT: 12 U/L (ref 0–44)
AST: 14 U/L — ABNORMAL LOW (ref 15–41)
Albumin: 3 g/dL — ABNORMAL LOW (ref 3.5–5.0)
Alkaline Phosphatase: 132 U/L — ABNORMAL HIGH (ref 38–126)
Anion gap: 8 (ref 5–15)
BUN: 5 mg/dL — ABNORMAL LOW (ref 6–20)
CO2: 23 mmol/L (ref 22–32)
Calcium: 9.2 mg/dL (ref 8.9–10.3)
Chloride: 106 mmol/L (ref 98–111)
Creatinine: 0.64 mg/dL (ref 0.44–1.00)
GFR, Estimated: >60 mL/min (ref >60)
Glucose, Bld: 114 mg/dL — ABNORMAL HIGH (ref 70–99)
Potassium: 3.5 mmol/L (ref 3.5–5.1)
Sodium: 137 mmol/L (ref 135–145)
Total Bilirubin: 0.3 mg/dL (ref 0.3–1.2)
Total Protein: 6.7 g/dL (ref 6.5–8.1)

## 2021-04-05 NOTE — Progress Notes (Signed)
[place] [phone]   [phone]  PROGRESS NOTE  Patient Care Team: Jearld Fenton, NP as PCP - General (Internal Medicine)  Hematological/Oncological History # History of von Willebrand disease 11/08/2020: established care with Dr. Lorenso Courier. Factor VIII 132%, vW Antigen 88%, Ristocetin Co-factor 87% (all WNL). Normal multimer analysis. PTT 28  Interval History:  Laura [name] 35 y.o. female with medical history significant for a history of vWD who presents for a follow up visit. The patient's last visit was on 11/08/2020. In the interim since the last visit her clinical course has been uncomplicated.  On exam today Laura [name] reports that she is doing well overall.  Unfortunately she was found to be anemic during her last blood check and is currently being set up for IV iron infusions.  She reports that she will be receiving these in [place] over the next 2 Mondays.  She does endorse being more tired and more winded when trying to walk.  She notes that she is not having any signs of bleeding, bruising, or dark stools.  She is otherwise had no major changes in her health and is quite excited for the upcoming delivery.  She notes that the due date is currently 05/17/2021.  She denies any fevers, chills, sweats, nausea, vomiting or diarrhea.  A full 10 point ROS is listed below.  MEDICAL HISTORY:  Past Medical History:  Diagnosis Date   History of chicken pox    Melanoma ([place])    left upper arm   Von Willebrand disease ([place])     SURGICAL HISTORY: No past surgical history on file.  SOCIAL HISTORY: Social History   Socioeconomic History   Marital status: Married    Spouse name: Not on file   Number of children: Not on file   Years of education: Not on file   Highest education level: Not on file  Occupational History   Occupation: teacher    Employer: [employer]  Tobacco Use   Smoking status: Never   Smokeless tobacco: Never  Vaping  Use   Vaping Use: Never used  Substance and Sexual Activity   Alcohol use: Not Currently   Drug use: No   Sexual activity: Yes    Partners: Male    Birth control/protection: None  Other Topics Concern   Not on file  Social History Narrative   Not on file   Social Determinants of Health   Financial Resource Strain: Not on file  Food Insecurity: Not on file  Transportation Needs: Not on file  Physical Activity: Not on file  Stress: Not on file  Social Connections: Not on file  Intimate Partner Violence: Not on file    FAMILY HISTORY: Family History  Problem Relation Age of Onset   Cancer Maternal Grandfather        oral   Heart disease Maternal Grandfather    Alcohol abuse Maternal Grandfather    Breast cancer Paternal Aunt 73   Depression Mother    Anxiety disorder Mother    Heart disease Paternal Grandfather     ALLERGIES:  is allergic to codeine.  MEDICATIONS:  Current Outpatient Medications  Medication Sig Dispense Refill   escitalopram (LEXAPRO) 10 MG tablet Take 1 tablet (10 mg total) by mouth daily. 90 tablet 1   ferrous sulfate (FERROUSUL) 325 (65 FE) MG tablet Take 1 tablet (325 mg total) by mouth every other day. 60 tablet 1   pantoprazole (PROTONIX) 20 MG tablet Take 1 tablet (  20 mg total) by mouth daily. You can increase to 2 tabs daily (40mg ) if 1 tab is not working 60 tablet 1   Prenatal Vit-Fe Fumarate-FA (MULTIVITAMIN-PRENATAL) 27-0.8 MG TABS tablet Take 1 tablet by mouth daily at 12 noon.     No current facility-administered medications for this visit.    REVIEW OF SYSTEMS:   Constitutional: ( - ) fevers, ( - )  chills , ( - ) night sweats Eyes: ( - ) blurriness of vision, ( - ) double vision, ( - ) watery eyes Ears, nose, mouth, throat, and face: ( - ) mucositis, ( - ) sore throat Respiratory: ( - ) cough, ( - ) dyspnea, ( - ) wheezes Cardiovascular: ( - ) palpitation, ( - ) chest discomfort, ( - ) lower extremity swelling Gastrointestinal:  ( -  ) nausea, ( - ) heartburn, ( - ) change in bowel habits Skin: ( - ) abnormal skin rashes Lymphatics: ( - ) new lymphadenopathy, ( - ) easy bruising Neurological: ( - ) numbness, ( - ) tingling, ( - ) new weaknesses Behavioral/Psych: ( - ) mood change, ( - ) new changes  All other systems were reviewed with the patient and are negative.  PHYSICAL EXAMINATION: ECOG PERFORMANCE STATUS: 1 - Symptomatic but completely ambulatory  Vitals:   04/05/21 1122  BP: 138/78  Pulse: (!) 102  Resp: 17  Temp: 98.5 F (36.9 C)  SpO2: 100%   Filed Weights   04/05/21 1122  Weight: 143 lb 11.2 oz (65.2 kg)    GENERAL: Well appearing young Caucasian female, pregnant.  Alert, no distress and comfortable SKIN: skin color, texture, turgor are normal, no rashes or significant lesions EYES: conjunctiva are pink and non-injected, sclera clear LUNGS: clear to auscultation and percussion with normal breathing effort HEART: regular rate & rhythm and no murmurs and no lower extremity edema ABDOMEN: Gravid uterus Musculoskeletal: no cyanosis of digits and no clubbing  PSYCH: alert & oriented x 3, fluent speech NEURO: no focal motor/sensory deficits  LABORATORY DATA:  I have reviewed the data as listed CBC Latest Ref Rng & Units 04/05/2021 04/02/2021 04/02/2021  WBC 4.0 - 10.5 K/uL 10.7(H) 11.4(H) 11.6(H)  Hemoglobin 12.0 - 15.0 g/dL 9.8(L) 9.7(L) 9.7(L)  Hematocrit 36.0 - 46.0 % 30.0(L) 29.0(L) 28.7(L)  Platelets 150 - 400 K/uL 293 323 322    CMP Latest Ref Rng & Units 04/05/2021 11/08/2020 07/13/2020  Glucose 70 - 99 mg/dL 114(H) 94 74  BUN 6 - 20 mg/dL 5(L) 5(L) 8  Creatinine 0.44 - 1.00 mg/dL 0.64 0.68 0.73  Sodium 135 - 145 mmol/L 137 137 137  Potassium 3.5 - 5.1 mmol/L 3.5 3.9 3.4(L)  Chloride 98 - 111 mmol/L 106 107 101  CO2 22 - 32 mmol/L 23 23 28   Calcium 8.9 - 10.3 mg/dL 9.2 8.9 9.2  Total Protein 6.5 - 8.1 g/dL 6.7 7.0 -  Total Bilirubin 0.3 - 1.2 mg/dL 0.3 0.4 -  Alkaline Phos 38 - 126 U/L  132(H) 46 -  AST 15 - 41 U/L 14(L) 12(L) -  ALT 0 - 44 U/L 12 9 -    RADIOGRAPHIC STUDIES: No results found.  ASSESSMENT & PLAN Laura [name] 35 y.o. female with medical history significant for a history of vWD who presents for a follow up visit.   The patient carries a diagnosis of von Willebrand's disease made in [place]. Fortunately von Willebrand levels tend to increase as one gets older and increase  with pregnancy.  It is possible the patient has von Willebrand factor which is within the normal limits despite having the condition. Additionally we will see her a few months after delivery to see if her von Willebrand levels have dipped back down into the deficient range.   In the event the patient is found to have levels consistent with von Willebrand disease I would recommend referral to [facility] hemophilia center and OB/GYN for high risk delivery.  I discussed this plan with the patient today and she voiced her understanding moving forward.  # History of von Willebrand disease --Patient has a remote history of von Willebrand disease diagnosed when he was approximately 57 to 35 years old. --She is had no major bleeding complications since dental procedures performed during grade school. --Today we will recheck her von Willebrand panel to include von Willebrand factor levels, factor VIII levels, PTT, and von Willebrand multimers.  These were normal on last check in February 2022. --Levels do increase during pregnancy and is possible the patient has normal levels.  We will plan to check her back approximately 3 months after delivery in order to see if the levels have declined. --In the event the patient has confirmed von Willebrand disease I would recommend referral to [place] for consideration of a high risk delivery at their hemophilia center. --Return to clinic in October 2022.  #Iron Deficiency Anemia in Pregnancy -- Agree with IV iron infusion as recommended by  the patient's OB/GYN.  This has been scheduled and will be performed at [facility] over the next 2 weeks.  No orders of the defined types were placed in this encounter.   All questions were answered. The patient knows to call the clinic with any problems, questions or concerns.  A total of more than 30 minutes were spent on this encounter with face-to-face time and non-face-to-face time, including preparing to see the patient, ordering tests and/or medications, counseling the patient and coordination of care as outlined above.   Ledell Peoples, MD Department of Hematology/Oncology [place] at [facility] Phone: [phone] Pager: [phone] Email: [email or web address]  04/07/2021 5:27 PM

## 2021-04-09 ENCOUNTER — Ambulatory Visit
Admission: RE | Admit: 2021-04-09 | Discharge: 2021-04-09 | Disposition: A | Discharge status: Home / Self Care | Payer: BC Managed Care – PPO | Source: Ambulatory Visit | Attending: Family Medicine | Admitting: Family Medicine

## 2021-04-09 ENCOUNTER — Telehealth: Payer: Self-pay | Admitting: Hematology and Oncology

## 2021-04-09 ENCOUNTER — Other Ambulatory Visit: Payer: Self-pay

## 2021-04-09 DIAGNOSIS — Z3A00 Weeks of gestation of pregnancy not specified: Secondary | ICD-10-CM | POA: Insufficient documentation

## 2021-04-09 DIAGNOSIS — D649 Anemia, unspecified: Secondary | ICD-10-CM | POA: Diagnosis not present

## 2021-04-09 DIAGNOSIS — O99013 Anemia complicating pregnancy, third trimester: Secondary | ICD-10-CM | POA: Insufficient documentation

## 2021-04-09 LAB — VON WILLEBRAND PANEL
Coagulation Factor VIII: 158 % — ABNORMAL HIGH (ref 56–140)
Ristocetin Co-factor, Plasma: 132 % (ref 50–200)
Von Willebrand Antigen, Plasma: 195 % (ref 50–200)

## 2021-04-09 LAB — COAG STUDIES INTERP REPORT

## 2021-04-09 MED ORDER — SODIUM CHLORIDE 0.9 % IV SOLN
500.0000 mg | INTRAVENOUS | Status: DC
  Administered 2021-04-09: 500 mg via INTRAVENOUS
  Filled 2021-04-09: qty 25

## 2021-04-09 NOTE — Telephone Encounter (Signed)
Scheduled per los. Called and left msg. Mailed printout  °

## 2021-04-12 ENCOUNTER — Telehealth: Payer: Self-pay | Admitting: *Deleted

## 2021-04-12 NOTE — Telephone Encounter (Signed)
-----   Message from Orson Slick, MD sent at 04/11/2021  8:39 AM EDT ----- Please let Laura [name] know that her von Willebrand levels are within normal limits. There are no special precautions needed prior to the delivery of her baby. We will plan to see her in Nov 2022 (about 3 months after delivery) to recheck her levels.   ----- Message ----- From: Buel Ream, Lab [facility] [place] Sent: 04/05/2021  11:17 AM EDT To: Orson Slick, MD

## 2021-04-12 NOTE — Telephone Encounter (Signed)
TCT patient regarding recent lab results.  No naswer but was bale to leave vm message for pt to call back at her earliest convenience.

## 2021-04-16 ENCOUNTER — Other Ambulatory Visit: Payer: Self-pay

## 2021-04-16 ENCOUNTER — Ambulatory Visit
Admission: RE | Admit: 2021-04-16 | Discharge: 2021-04-16 | Disposition: A | Discharge status: Home / Self Care | Payer: BC Managed Care – PPO | Source: Ambulatory Visit | Attending: Obstetrics and Gynecology | Admitting: Obstetrics and Gynecology

## 2021-04-16 DIAGNOSIS — O99013 Anemia complicating pregnancy, third trimester: Secondary | ICD-10-CM | POA: Insufficient documentation

## 2021-04-16 DIAGNOSIS — D649 Anemia, unspecified: Secondary | ICD-10-CM | POA: Insufficient documentation

## 2021-04-16 DIAGNOSIS — Z3A00 Weeks of gestation of pregnancy not specified: Secondary | ICD-10-CM | POA: Diagnosis not present

## 2021-04-16 MED ORDER — SODIUM CHLORIDE 0.9 % IV SOLN
500.0000 mg | Freq: Once | INTRAVENOUS | Status: AC
  Administered 2021-04-16: 500 mg via INTRAVENOUS
  Filled 2021-04-16: qty 25

## 2021-04-23 ENCOUNTER — Other Ambulatory Visit: Payer: Self-pay

## 2021-04-23 ENCOUNTER — Other Ambulatory Visit (HOSPITAL_COMMUNITY)
Admission: RE | Admit: 2021-04-23 | Discharge: 2021-04-23 | Disposition: A | Discharge status: Home / Self Care | Payer: BC Managed Care – PPO | Source: Ambulatory Visit | Attending: Obstetrics & Gynecology | Admitting: Obstetrics & Gynecology

## 2021-04-23 ENCOUNTER — Ambulatory Visit (INDEPENDENT_AMBULATORY_CARE_PROVIDER_SITE_OTHER): Payer: BC Managed Care – PPO | Admitting: Obstetrics & Gynecology

## 2021-04-23 ENCOUNTER — Encounter: Payer: Self-pay | Admitting: Obstetrics & Gynecology

## 2021-04-23 VITALS — BP 118/72 | HR 112 | Wt 145.0 lb

## 2021-04-23 DIAGNOSIS — Z3A36 36 weeks gestation of pregnancy: Secondary | ICD-10-CM

## 2021-04-23 DIAGNOSIS — O0990 Supervision of high risk pregnancy, unspecified, unspecified trimester: Secondary | ICD-10-CM | POA: Diagnosis present

## 2021-04-23 DIAGNOSIS — O09523 Supervision of elderly multigravida, third trimester: Secondary | ICD-10-CM

## 2021-04-23 DIAGNOSIS — O99013 Anemia complicating pregnancy, third trimester: Secondary | ICD-10-CM

## 2021-04-23 DIAGNOSIS — D6800 Von Willebrand disease, unspecified: Secondary | ICD-10-CM

## 2021-04-23 NOTE — Patient Instructions (Signed)
Return to office for any scheduled appointments. Call the office or go to the MAU at [facility] [place] if:  You begin to have strong, frequent contractions  Your water breaks.  Sometimes it is a big gush of fluid, sometimes it is just a trickle that keeps getting your panties wet or running down your legs  You have vaginal bleeding.  It is normal to have a small amount of spotting if your cervix was checked.   You do not feel your baby moving like normal.  If you do not, get something to eat and drink and lay down and focus on feeling your baby move.   If your baby is still not moving like normal, you should call the office or go to MAU.  Any other obstetric concerns.   

## 2021-04-23 NOTE — Progress Notes (Signed)
   PRENATAL VISIT NOTE  Subjective:  Laura [name] is a 35 y.o. G1P0000 at 48w4dbeing seen today for ongoing prenatal care.  She is currently monitored for the following issues for this high-risk pregnancy and has Von Willebrand's disease ([place]; Melanoma of skin ([place]; GAD (generalized anxiety disorder); Supervision of high risk pregnancy, antepartum; AMA (advanced maternal age) multigravida 361+ Anemia during pregnancy in third trimester; and Gastroesophageal reflux disease without esophagitis on their problem list.  Patient reports no complaints.  Contractions: Irritability. Vag. Bleeding: None.  Movement: Present. Denies leaking of fluid.   The following portions of the patient's history were reviewed and updated as appropriate: allergies, current medications, past family history, past medical history, past social history, past surgical history and problem list.   Objective:   Vitals:   04/23/21 1409  BP: 118/72  Pulse: (!) 112  Weight: 145 lb (65.8 kg)    Fetal Status: Fetal Heart Rate (bpm): 125 Fundal Height: 35 cm Movement: Present  Presentation: Vertex  General:  Alert, oriented and cooperative. Patient is in no acute distress.  Skin: Skin is warm and dry. No rash noted.   Cardiovascular: Normal heart rate noted  Respiratory: Normal respiratory effort, no problems with respiration noted  Abdomen: Soft, gravid, appropriate for gestational age.  Pain/Pressure: Present     Pelvic: Cervical exam performed in the presence of a chaperone Dilation: Closed Effacement (%): 50 Station: Ballotable  Extremities: Normal range of motion.  Edema: None  Mental Status: Normal mood and affect. Normal behavior. Normal judgment and thought content.   Assessment and Plan:  Pregnancy: G1P0000 at 327w4d. Anemia during pregnancy in third trimester Received second Venofer dose last week  2. Von Willebrand's disease ([facility] CenterWill follow [facility] recommendations  3. Multigravida of advanced maternal  age in third trimester 4. [redacted] weeks gestation of pregnancy 5. Supervision of high risk pregnancy, antepartum Pelvic cultures done today, will follow up results and manage accordingly. - Strep Gp B NAA - GC/Chlamydia probe amp ([place])not [facility]re[facility] labor symptoms and general obstetric precautions including but not limited to vaginal bleeding, contractions, leaking of fluid and fetal movement were reviewed in detail with the patient. Please refer to After Visit Summary for other counseling recommendations.   Return in about 1 week (around 04/30/2021) for OB visits as scheduled.  Future Appointments  Date Time Provider [place]8/04/2021  1:30 PM PrDonnamae JudeMD [facility]  05/07/2021  1:10 PM PrDonnamae JudeMD [facility]  05/14/2021  1:10 PM PiAletha HalimMD [facility]  08/10/2021  7:45 AM [facility] None  08/10/2021  8:40 AM DoOrson SlickMD [facility] None    UgVerita SchneidersMD

## 2021-04-25 ENCOUNTER — Other Ambulatory Visit: Payer: Self-pay | Admitting: Obstetrics and Gynecology

## 2021-04-25 LAB — STREP GP B NAA: Strep Gp B NAA: NEGATIVE

## 2021-04-25 LAB — GC/CHLAMYDIA PROBE AMP (~~LOC~~) NOT AT ARMC
Chlamydia: NEGATIVE
Comment: NEGATIVE
Comment: NORMAL
Neisseria Gonorrhea: NEGATIVE

## 2021-04-30 ENCOUNTER — Encounter: Payer: Self-pay | Admitting: *Deleted

## 2021-04-30 ENCOUNTER — Ambulatory Visit (INDEPENDENT_AMBULATORY_CARE_PROVIDER_SITE_OTHER): Payer: BC Managed Care – PPO | Admitting: Family Medicine

## 2021-04-30 ENCOUNTER — Other Ambulatory Visit: Payer: Self-pay

## 2021-04-30 VITALS — BP 118/72 | HR 109 | Wt 146.0 lb

## 2021-04-30 DIAGNOSIS — Z3A37 37 weeks gestation of pregnancy: Secondary | ICD-10-CM

## 2021-04-30 DIAGNOSIS — C439 Malignant melanoma of skin, unspecified: Secondary | ICD-10-CM

## 2021-04-30 DIAGNOSIS — O09523 Supervision of elderly multigravida, third trimester: Secondary | ICD-10-CM

## 2021-04-30 DIAGNOSIS — D6800 Von Willebrand disease, unspecified: Secondary | ICD-10-CM

## 2021-04-30 DIAGNOSIS — O99013 Anemia complicating pregnancy, third trimester: Secondary | ICD-10-CM

## 2021-04-30 DIAGNOSIS — O0990 Supervision of high risk pregnancy, unspecified, unspecified trimester: Secondary | ICD-10-CM

## 2021-04-30 NOTE — Patient Instructions (Signed)
[email or web address]">  Third Trimester of Pregnancy  The third trimester of pregnancy is from week 28 through week 40. This is months 7 through 9. The third trimester is a time when the unborn baby (fetus) is growing rapidly. At the end of the ninth month, the fetus is about 20inches long and weighs 6-10 pounds. Body changes during your third trimester During the third trimester, your body will continue to go through many changes.The changes vary and generally return to normal after your baby is born. Physical changes Your weight will continue to increase. You can expect to gain 25-35 pounds (11-16 kg) by the end of the pregnancy if you begin pregnancy at a normal weight. If you are underweight, you can expect to gain 28-40 lb (about 13-18 kg), and if you are overweight, you can expect to gain 15-25 lb (about 7-11 kg). You may begin to get stretch marks on your hips, abdomen, and breasts. Your breasts will continue to grow and may hurt. A yellow fluid (colostrum) may leak from your breasts. This is the first milk you are producing for your baby. You may have changes in your hair. These can include thickening of your hair, rapid growth, and changes in texture. Some people also have hair loss during or after pregnancy, or hair that feels dry or thin. Your belly button may stick out. You may notice more swelling in your hands, face, or ankles. Health changes You may have heartburn. You may have constipation. You may develop hemorrhoids. You may develop swollen, bulging veins (varicose veins) in your legs. You may have increased body aches in the pelvis, back, or thighs. This is due to weight gain and increased hormones that are relaxing your joints. You may have increased tingling or numbness in your hands, arms, and legs. The skin on your abdomen may also feel numb. You may feel short of breath because of your expanding uterus. Other  changes You may urinate more often because the fetus is moving lower into your pelvis and pressing on your bladder. You may have more problems sleeping. This may be caused by the size of your abdomen, an increased need to urinate, and an increase in your body's metabolism. You may notice the fetus "dropping," or moving lower in your abdomen (lightening). You may have increased vaginal discharge. You may notice that you have pain around your pelvic bone as your uterus distends. Follow these instructions at home: Medicines Follow your health care provider's instructions regarding medicine use. Specific medicines may be either safe or unsafe to take during pregnancy. Do not take any medicines unless approved by your health care provider. Take a prenatal vitamin that contains at least 600 micrograms (mcg) of folic acid. Eating and drinking Eat a healthy diet that includes fresh fruits and vegetables, whole grains, good sources of protein such as meat, eggs, or tofu, and low-fat dairy products. Avoid raw meat and unpasteurized juice, milk, and cheese. These carry germs that can harm you and your baby. Eat 4 or 5 small meals rather than 3 large meals a day. You may need to take these actions to prevent or treat constipation: Drink enough fluid to keep your urine pale yellow. Eat foods that are high in fiber, such as beans, whole grains, and fresh fruits and vegetables. Limit foods that are high in fat and processed sugars, such as fried or sweet foods. Activity Exercise only as directed by your health care provider. Most people can continue their usual exercise routine during pregnancy. Try  to exercise for 30 minutes at least 5 days a week. Stop exercising if you experience contractions in the uterus. Stop exercising if you develop pain or cramping in the lower abdomen or lower back. Avoid heavy lifting. Do not exercise if it is very hot or humid or if you are at a high altitude. If you choose to,  you may continue to have sex unless your health care provider tells you not to. Relieving pain and discomfort Take frequent breaks and rest with your legs raised (elevated) if you have leg cramps or low back pain. Take warm sitz baths to soothe any pain or discomfort caused by hemorrhoids. Use hemorrhoid cream if your health care provider approves. Wear a supportive bra to prevent discomfort from breast tenderness. If you develop varicose veins: Wear support hose as told by your health care provider. Elevate your feet for 15 minutes, 3-4 times a day. Limit salt in your diet. Safety Talk to your health care provider before traveling far distances. Do not use hot tubs, steam rooms, or saunas. Wear your seat belt at all times when driving or riding in a car. Talk with your health care provider if someone is verbally or physically abusive to you. Preparing for birth To prepare for the arrival of your baby: Take prenatal classes to understand, practice, and ask questions about labor and delivery. Visit the hospital and tour the maternity area. Purchase a rear-facing car seat and make sure you know how to install it in your car. Prepare the baby's room or sleeping area. Make sure to remove all pillows and stuffed animals from the baby's crib to prevent suffocation. General instructions Avoid cat litter boxes and soil used by cats. These carry germs that can cause birth defects in the baby. If you have a cat, ask someone to clean the litter box for you. Do not douche or use tampons. Do not use scented sanitary pads. Do not use any products that contain nicotine or tobacco, such as cigarettes, e-cigarettes, and chewing tobacco. If you need help quitting, ask your health care provider. Do not use any herbal remedies, illegal drugs, or medicines that were not prescribed to you. Chemicals in these products can harm your baby. Do not drink alcohol. You will have more frequent prenatal exams during the  third trimester. During a routine prenatal visit, your health care provider will do a physical exam, perform tests, and discuss your overall health. Keep all follow-up visits. This is important. Where to find more information American Pregnancy Association: americanpregnancy.[place] and Gynecologists: PoolDevices.com.pt Office on [employer] Health: KeywordPortfolios.com.br Contact a health care provider if you have: A fever. Mild pelvic cramps, pelvic pressure, or nagging pain in your abdominal area or lower back. Vomiting or diarrhea. Bad-smelling vaginal discharge or foul-smelling urine. Pain when you urinate. A headache that does not go away when you take medicine. Visual changes or see spots in front of your eyes. Get help right away if: Your water breaks. You have regular contractions less than 5 minutes apart. You have spotting or bleeding from your vagina. You have severe abdominal pain. You have difficulty breathing. You have chest pain. You have fainting spells. You have not felt your baby move for the time period told by your health care provider. You have new or increased pain, swelling, or redness in an arm or leg. Summary The third trimester of pregnancy is from week 28 through week 40 (months 7 through 9). You may have more problems  sleeping. This can be caused by the size of your abdomen, an increased need to urinate, and an increase in your body's metabolism. You will have more frequent prenatal exams during the third trimester. Keep all follow-up visits. This is important. This information is not intended to replace advice given to you by your health care provider. Make sure you discuss any questions you have with your healthcare provider. Document Revised: 02/16/2020 Document Reviewed: 12/23/2019 Elsevier Patient Education  2022 [employer].

## 2021-04-30 NOTE — Progress Notes (Signed)
   PRENATAL VISIT NOTE  Subjective:  Laura [name] is a 35 y.o. G1P0000 at 27w4dbeing seen today for ongoing prenatal care.  She is currently monitored for the following issues for this high-risk pregnancy and has Von Willebrand's disease ([place]; Melanoma of skin ([place]; GAD (generalized anxiety disorder); Supervision of high risk pregnancy, antepartum; AMA (advanced maternal age) multigravida 319+ Anemia during pregnancy in third trimester; and Gastroesophageal reflux disease without esophagitis on their problem list.  Patient reports no complaints.  Contractions: Regular. Vag. Bleeding: None.  Movement: Present. Denies leaking of fluid.   The following portions of the patient's history were reviewed and updated as appropriate: allergies, current medications, past family history, past medical history, past social history, past surgical history and problem list.   Objective:   Vitals:   04/30/21 1336  BP: 118/72  Pulse: (!) 109  Weight: 146 lb (66.2 kg)    Fetal Status: Fetal Heart Rate (bpm): 130 Fundal Height: 34 cm Movement: Present  Presentation: Vertex  General:  Alert, oriented and cooperative. Patient is in no acute distress.  Skin: Skin is warm and dry. No rash noted.   Cardiovascular: Normal heart rate noted  Respiratory: Normal respiratory effort, no problems with respiration noted  Abdomen: Soft, gravid, appropriate for gestational age.  Pain/Pressure: Present     Pelvic: Cervical exam deferred        Extremities: Normal range of motion.  Edema: None  Mental Status: Normal mood and affect. Normal behavior. Normal judgment and thought content.   Assessment and Plan:  Pregnancy: G1P0000 at 313w4d. Von Willebrand's disease (HCSpartansburgNormal levels Can deliver here  2. Melanoma of skin (HCMaltaS/p removal--for placental pathology  3. Supervision of high risk pregnancy, antepartum Continue prenatal care.   4.7Multigravida of advanced maternal age in third  trimester Normal NIPT  5. Anemia during pregnancy in third trimester S/p IV iron infusion x 2.  Preterm labor symptoms and general obstetric precautions including but not limited to vaginal bleeding, contractions, leaking of fluid and fetal movement were reviewed in detail with the patient. Please refer to After Visit Summary for other counseling recommendations.   Return in 1 week (on 05/07/2021).  Future Appointments  Date Time Provider [place]8/15/2022  1:10 PM PrDonnamae JudeMD [facility]  05/14/2021  1:10 PM PiAletha HalimMD [facility]  08/10/2021  7:45 AM [facility] None  08/10/2021  8:40 AM DoOrson SlickMD [facility] None    TaDonnamae JudeMD

## 2021-05-07 ENCOUNTER — Other Ambulatory Visit: Payer: Self-pay

## 2021-05-07 ENCOUNTER — Ambulatory Visit (INDEPENDENT_AMBULATORY_CARE_PROVIDER_SITE_OTHER): Payer: BC Managed Care – PPO | Admitting: Family Medicine

## 2021-05-07 VITALS — BP 128/79 | HR 112 | Wt 146.0 lb

## 2021-05-07 DIAGNOSIS — D6800 Von Willebrand disease, unspecified: Secondary | ICD-10-CM

## 2021-05-07 DIAGNOSIS — K219 Gastro-esophageal reflux disease without esophagitis: Secondary | ICD-10-CM

## 2021-05-07 DIAGNOSIS — C439 Malignant melanoma of skin, unspecified: Secondary | ICD-10-CM

## 2021-05-07 DIAGNOSIS — F411 Generalized anxiety disorder: Secondary | ICD-10-CM

## 2021-05-07 DIAGNOSIS — O09523 Supervision of elderly multigravida, third trimester: Secondary | ICD-10-CM

## 2021-05-07 DIAGNOSIS — O0990 Supervision of high risk pregnancy, unspecified, unspecified trimester: Secondary | ICD-10-CM

## 2021-05-07 NOTE — Patient Instructions (Signed)
[email or web address]">  Third Trimester of Pregnancy  The third trimester of pregnancy is from week 28 through week 40. This is months 7 through 9. The third trimester is a time when the unborn baby (fetus) is growing rapidly. At the end of the ninth month, the fetus is about 20inches long and weighs 6-10 pounds. Body changes during your third trimester During the third trimester, your body will continue to go through many changes.The changes vary and generally return to normal after your baby is born. Physical changes Your weight will continue to increase. You can expect to gain 25-35 pounds (11-16 kg) by the end of the pregnancy if you begin pregnancy at a normal weight. If you are underweight, you can expect to gain 28-40 lb (about 13-18 kg), and if you are overweight, you can expect to gain 15-25 lb (about 7-11 kg). You may begin to get stretch marks on your hips, abdomen, and breasts. Your breasts will continue to grow and may hurt. A yellow fluid (colostrum) may leak from your breasts. This is the first milk you are producing for your baby. You may have changes in your hair. These can include thickening of your hair, rapid growth, and changes in texture. Some people also have hair loss during or after pregnancy, or hair that feels dry or thin. Your belly button may stick out. You may notice more swelling in your hands, face, or ankles. Health changes You may have heartburn. You may have constipation. You may develop hemorrhoids. You may develop swollen, bulging veins (varicose veins) in your legs. You may have increased body aches in the pelvis, back, or thighs. This is due to weight gain and increased hormones that are relaxing your joints. You may have increased tingling or numbness in your hands, arms, and legs. The skin on your abdomen may also feel numb. You may feel short of breath because of your expanding uterus. Other  changes You may urinate more often because the fetus is moving lower into your pelvis and pressing on your bladder. You may have more problems sleeping. This may be caused by the size of your abdomen, an increased need to urinate, and an increase in your body's metabolism. You may notice the fetus "dropping," or moving lower in your abdomen (lightening). You may have increased vaginal discharge. You may notice that you have pain around your pelvic bone as your uterus distends. Follow these instructions at home: Medicines Follow your health care provider's instructions regarding medicine use. Specific medicines may be either safe or unsafe to take during pregnancy. Do not take any medicines unless approved by your health care provider. Take a prenatal vitamin that contains at least 600 micrograms (mcg) of folic acid. Eating and drinking Eat a healthy diet that includes fresh fruits and vegetables, whole grains, good sources of protein such as meat, eggs, or tofu, and low-fat dairy products. Avoid raw meat and unpasteurized juice, milk, and cheese. These carry germs that can harm you and your baby. Eat 4 or 5 small meals rather than 3 large meals a day. You may need to take these actions to prevent or treat constipation: Drink enough fluid to keep your urine pale yellow. Eat foods that are high in fiber, such as beans, whole grains, and fresh fruits and vegetables. Limit foods that are high in fat and processed sugars, such as fried or sweet foods. Activity Exercise only as directed by your health care provider. Most people can continue their usual exercise routine during pregnancy. Try  to exercise for 30 minutes at least 5 days a week. Stop exercising if you experience contractions in the uterus. Stop exercising if you develop pain or cramping in the lower abdomen or lower back. Avoid heavy lifting. Do not exercise if it is very hot or humid or if you are at a high altitude. If you choose to,  you may continue to have sex unless your health care provider tells you not to. Relieving pain and discomfort Take frequent breaks and rest with your legs raised (elevated) if you have leg cramps or low back pain. Take warm sitz baths to soothe any pain or discomfort caused by hemorrhoids. Use hemorrhoid cream if your health care provider approves. Wear a supportive bra to prevent discomfort from breast tenderness. If you develop varicose veins: Wear support hose as told by your health care provider. Elevate your feet for 15 minutes, 3-4 times a day. Limit salt in your diet. Safety Talk to your health care provider before traveling far distances. Do not use hot tubs, steam rooms, or saunas. Wear your seat belt at all times when driving or riding in a car. Talk with your health care provider if someone is verbally or physically abusive to you. Preparing for birth To prepare for the arrival of your baby: Take prenatal classes to understand, practice, and ask questions about labor and delivery. Visit the hospital and tour the maternity area. Purchase a rear-facing car seat and make sure you know how to install it in your car. Prepare the baby's room or sleeping area. Make sure to remove all pillows and stuffed animals from the baby's crib to prevent suffocation. General instructions Avoid cat litter boxes and soil used by cats. These carry germs that can cause birth defects in the baby. If you have a cat, ask someone to clean the litter box for you. Do not douche or use tampons. Do not use scented sanitary pads. Do not use any products that contain nicotine or tobacco, such as cigarettes, e-cigarettes, and chewing tobacco. If you need help quitting, ask your health care provider. Do not use any herbal remedies, illegal drugs, or medicines that were not prescribed to you. Chemicals in these products can harm your baby. Do not drink alcohol. You will have more frequent prenatal exams during the  third trimester. During a routine prenatal visit, your health care provider will do a physical exam, perform tests, and discuss your overall health. Keep all follow-up visits. This is important. Where to find more information American Pregnancy Association: americanpregnancy.[place] and Gynecologists: PoolDevices.com.pt Office on [employer] Health: KeywordPortfolios.com.br Contact a health care provider if you have: A fever. Mild pelvic cramps, pelvic pressure, or nagging pain in your abdominal area or lower back. Vomiting or diarrhea. Bad-smelling vaginal discharge or foul-smelling urine. Pain when you urinate. A headache that does not go away when you take medicine. Visual changes or see spots in front of your eyes. Get help right away if: Your water breaks. You have regular contractions less than 5 minutes apart. You have spotting or bleeding from your vagina. You have severe abdominal pain. You have difficulty breathing. You have chest pain. You have fainting spells. You have not felt your baby move for the time period told by your health care provider. You have new or increased pain, swelling, or redness in an arm or leg. Summary The third trimester of pregnancy is from week 28 through week 40 (months 7 through 9). You may have more problems  sleeping. This can be caused by the size of your abdomen, an increased need to urinate, and an increase in your body's metabolism. You will have more frequent prenatal exams during the third trimester. Keep all follow-up visits. This is important. This information is not intended to replace advice given to you by your health care provider. Make sure you discuss any questions you have with your healthcare provider. Document Revised: 02/16/2020 Document Reviewed: 12/23/2019 Elsevier Patient Education  2022 [employer].

## 2021-05-07 NOTE — Progress Notes (Signed)
   PRENATAL VISIT NOTE  Subjective:  Laura [name] is a 35 y.o. G1P0000 at 76w4dbeing seen today for ongoing prenatal care.  She is currently monitored for the following issues for this high-risk pregnancy and has Von Willebrand's disease ([place]; Melanoma of skin ([place]; GAD (generalized anxiety disorder); Supervision of high risk pregnancy, antepartum; AMA (advanced maternal age) multigravida 372+ Anemia during pregnancy in third trimester; and Gastroesophageal reflux disease without esophagitis on their problem list.  Patient reports no complaints.  Contractions: Irregular. Vag. Bleeding: None.  Movement: Present. Denies leaking of fluid.   The following portions of the patient's history were reviewed and updated as appropriate: allergies, current medications, past family history, past medical history, past social history, past surgical history and problem list.   Objective:   Vitals:   05/07/21 1310  BP: 128/79  Pulse: (!) 112  Weight: 146 lb (66.2 kg)    Fetal Status: Fetal Heart Rate (bpm): 144 Fundal Height: 35 cm Movement: Present  Presentation: Vertex  General:  Alert, oriented and cooperative. Patient is in no acute distress.  Skin: Skin is warm and dry. No rash noted.   Cardiovascular: Normal heart rate noted  Respiratory: Normal respiratory effort, no problems with respiration noted  Abdomen: Soft, gravid, appropriate for gestational age.  Pain/Pressure: Present     Pelvic: Cervical exam performed in the presence of a chaperone Dilation: 1 Effacement (%): 70 Station: -2  Extremities: Normal range of motion.  Edema: None  Mental Status: Normal mood and affect. Normal behavior. Normal judgment and thought content.   Assessment and Plan:  Pregnancy: G1P0000 at 328w4d. Gastroesophageal reflux disease without esophagitis Continue PPI  2. Melanoma of skin (HCFisherWill send placenta to path  3. Von Willebrand's disease (HCSouth HempsteadNormal levels--[facility].  Multigravida of advanced maternal age in third trimester Normal NIPT  5. GAD (generalized anxiety disorder) Continue Lexapro  6. Supervision of high risk pregnancy, antepartum Continue routine prenatal care. OkWhite Swanith continued expectant management and no IOL right now.  Term labor symptoms and general obstetric precautions including but not limited to vaginal bleeding, contractions, leaking of fluid and fetal movement were reviewed in detail with the patient. Please refer to After Visit Summary for other counseling recommendations.   Return in 1 week (on 05/14/2021).  Future Appointments  Date Time Provider De[place]8/22/2022  1:10 PM PiAletha HalimMD [facility]  08/10/2021  7:45 AM [facility] None  08/10/2021  8:40 AM DoOrson SlickMD [facility] None    TaDonnamae JudeMD

## 2021-05-14 ENCOUNTER — Other Ambulatory Visit (HOSPITAL_COMMUNITY): Payer: Self-pay | Admitting: Advanced Practice Midwife

## 2021-05-14 ENCOUNTER — Other Ambulatory Visit: Payer: Self-pay

## 2021-05-14 ENCOUNTER — Ambulatory Visit (INDEPENDENT_AMBULATORY_CARE_PROVIDER_SITE_OTHER): Payer: BC Managed Care – PPO | Admitting: Obstetrics and Gynecology

## 2021-05-14 VITALS — BP 121/77 | HR 103 | Wt 147.0 lb

## 2021-05-14 DIAGNOSIS — O99013 Anemia complicating pregnancy, third trimester: Secondary | ICD-10-CM

## 2021-05-14 DIAGNOSIS — O0990 Supervision of high risk pregnancy, unspecified, unspecified trimester: Secondary | ICD-10-CM

## 2021-05-14 DIAGNOSIS — D6800 Von Willebrand disease, unspecified: Secondary | ICD-10-CM

## 2021-05-14 DIAGNOSIS — O09523 Supervision of elderly multigravida, third trimester: Secondary | ICD-10-CM

## 2021-05-14 DIAGNOSIS — C439 Malignant melanoma of skin, unspecified: Secondary | ICD-10-CM

## 2021-05-14 DIAGNOSIS — Z3A39 39 weeks gestation of pregnancy: Secondary | ICD-10-CM

## 2021-05-14 NOTE — Progress Notes (Signed)
   PRENATAL VISIT NOTE  Subjective:  Laura [name] is a 35 y.o. G1P0000 at 15w4dbeing seen today for ongoing prenatal care.  She is currently monitored for the following issues for this low-risk pregnancy and has Von Willebrand's disease ([place]; Melanoma of skin (HI[place]; GAD (generalized anxiety disorder); Supervision of high risk pregnancy, antepartum; AMA (advanced maternal age) multigravida 335+ Anemia during pregnancy in third trimester; and Gastroesophageal reflux disease without esophagitis on their problem list.  Patient reports no complaints.  Contractions: Irregular. Vag. Bleeding: None.  Movement: Present. Denies leaking of fluid.   The following portions of the patient's history were reviewed and updated as appropriate: allergies, current medications, past family history, past medical history, past social history, past surgical history and problem list.   Objective:   Vitals:   05/14/21 1309  BP: 121/77  Pulse: (!) 103  Weight: 147 lb (66.7 kg)    Fetal Status: Fetal Heart Rate (bpm): 137 Fundal Height: 39 cm Movement: Present  Presentation: Vertex  General:  Alert, oriented and cooperative. Patient is in no acute distress.  Skin: Skin is warm and dry. No rash noted.   Cardiovascular: Normal heart rate noted  Respiratory: Normal respiratory effort, no problems with respiration noted  Abdomen: Soft, gravid, appropriate for gestational age.  Pain/Pressure: Present     Pelvic: Cervical exam deferred        Extremities: Normal range of motion.  Edema: Trace  Mental Status: Normal mood and affect. Normal behavior. Normal judgment and thought content.   Assessment and Plan:  Pregnancy: G1P0000 at 335w4d. [redacted] weeks gestation of pregnancy  2. Von Willebrand's disease (HCLorenzoNormal labs with H/O last month. Nothing special for delivery. RTC 3 months PP  3. Melanoma of skin (HCCrystalPlacenta to pathology  4. Multigravida of advanced maternal age in third trimester No  issues  5. Supervision of high risk pregnancy, antepartum Set up for IOL on 8/28 at 2345  6. Anemia during pregnancy in third trimester S/p IV iron x 2 during pregnancy  Term labor symptoms and general obstetric precautions including but not limited to vaginal bleeding, contractions, leaking of fluid and fetal movement were reviewed in detail with the patient. Please refer to After Visit Summary for other counseling recommendations.   No follow-ups on file.  Future Appointments  Date Time Provider DeChoctaw Lake11/18/2022  7:45 AM [facility] None  08/10/2021  8:40 AM DoOrson SlickMD [facility]    ChAletha HalimMD

## 2021-05-15 ENCOUNTER — Telehealth (HOSPITAL_COMMUNITY): Payer: Self-pay | Admitting: *Deleted

## 2021-05-15 NOTE — Telephone Encounter (Signed)
Preadmission screen  

## 2021-05-16 ENCOUNTER — Telehealth (HOSPITAL_COMMUNITY): Payer: Self-pay | Admitting: *Deleted

## 2021-05-16 ENCOUNTER — Encounter (HOSPITAL_COMMUNITY): Payer: Self-pay | Admitting: *Deleted

## 2021-05-16 ENCOUNTER — Encounter (HOSPITAL_COMMUNITY): Payer: Self-pay

## 2021-05-16 NOTE — Telephone Encounter (Signed)
Preadmission screen  

## 2021-05-18 ENCOUNTER — Other Ambulatory Visit: Payer: Self-pay | Admitting: Family Medicine

## 2021-05-19 ENCOUNTER — Inpatient Hospital Stay (HOSPITAL_COMMUNITY): Payer: BC Managed Care – PPO | Admitting: Anesthesiology

## 2021-05-19 ENCOUNTER — Encounter (HOSPITAL_COMMUNITY): Payer: Self-pay | Admitting: Obstetrics and Gynecology

## 2021-05-19 ENCOUNTER — Encounter (HOSPITAL_COMMUNITY)
Admission: AD | Disposition: A | Discharge status: Home / Self Care | Payer: Self-pay | Source: Home / Self Care | Attending: Obstetrics and Gynecology

## 2021-05-19 ENCOUNTER — Other Ambulatory Visit: Payer: Self-pay

## 2021-05-19 ENCOUNTER — Inpatient Hospital Stay (HOSPITAL_COMMUNITY)
Admission: AD | Admit: 2021-05-19 | Discharge: 2021-05-22 | DRG: 787 | Disposition: A | Discharge status: Home / Self Care | Payer: BC Managed Care – PPO | Attending: Obstetrics and Gynecology | Admitting: Obstetrics and Gynecology

## 2021-05-19 DIAGNOSIS — F411 Generalized anxiety disorder: Secondary | ICD-10-CM | POA: Diagnosis present

## 2021-05-19 DIAGNOSIS — D62 Acute posthemorrhagic anemia: Secondary | ICD-10-CM | POA: Diagnosis not present

## 2021-05-19 DIAGNOSIS — D6800 Von Willebrand disease, unspecified: Secondary | ICD-10-CM | POA: Diagnosis present

## 2021-05-19 DIAGNOSIS — Z20822 Contact with and (suspected) exposure to covid-19: Secondary | ICD-10-CM | POA: Diagnosis present

## 2021-05-19 DIAGNOSIS — O9962 Diseases of the digestive system complicating childbirth: Principal | ICD-10-CM | POA: Diagnosis present

## 2021-05-19 DIAGNOSIS — K219 Gastro-esophageal reflux disease without esophagitis: Secondary | ICD-10-CM | POA: Diagnosis present

## 2021-05-19 DIAGNOSIS — O09529 Supervision of elderly multigravida, unspecified trimester: Secondary | ICD-10-CM

## 2021-05-19 DIAGNOSIS — O99344 Other mental disorders complicating childbirth: Secondary | ICD-10-CM | POA: Diagnosis present

## 2021-05-19 DIAGNOSIS — O9081 Anemia of the puerperium: Secondary | ICD-10-CM | POA: Diagnosis not present

## 2021-05-19 DIAGNOSIS — O99013 Anemia complicating pregnancy, third trimester: Secondary | ICD-10-CM | POA: Diagnosis present

## 2021-05-19 DIAGNOSIS — Z3A40 40 weeks gestation of pregnancy: Secondary | ICD-10-CM

## 2021-05-19 DIAGNOSIS — O26893 Other specified pregnancy related conditions, third trimester: Secondary | ICD-10-CM | POA: Diagnosis present

## 2021-05-19 DIAGNOSIS — C439 Malignant melanoma of skin, unspecified: Secondary | ICD-10-CM | POA: Diagnosis present

## 2021-05-19 DIAGNOSIS — O0990 Supervision of high risk pregnancy, unspecified, unspecified trimester: Secondary | ICD-10-CM

## 2021-05-19 LAB — CBC
HCT: 38.4 % (ref 36.0–46.0)
Hemoglobin: 12.6 g/dL (ref 12.0–15.0)
MCH: 31.1 pg (ref 26.0–34.0)
MCHC: 32.8 g/dL (ref 30.0–36.0)
MCV: 94.8 fL (ref 80.0–100.0)
Platelets: 257 10*3/uL (ref 150–400)
RBC: 4.05 MIL/uL (ref 3.87–5.11)
RDW: 15.4 % (ref 11.5–15.5)
WBC: 15.7 10*3/uL — ABNORMAL HIGH (ref 4.0–10.5)
nRBC: 0 % (ref 0.0–0.2)

## 2021-05-19 LAB — RPR: RPR Ser Ql: NONREACTIVE

## 2021-05-19 LAB — SARS CORONAVIRUS 2 (TAT 6-24 HRS): SARS Coronavirus 2: NEGATIVE

## 2021-05-19 LAB — TYPE AND SCREEN
ABO/RH(D): O POS
Antibody Screen: NEGATIVE

## 2021-05-19 SURGERY — Surgical Case
Anesthesia: Epidural | Laterality: Bilateral

## 2021-05-19 MED ORDER — MEPERIDINE HCL 25 MG/ML IJ SOLN: 6.2500 mg | INTRAMUSCULAR | Status: DC | PRN

## 2021-05-19 MED ORDER — PHENYLEPHRINE 40 MCG/ML (10ML) SYRINGE FOR IV PUSH (FOR BLOOD PRESSURE SUPPORT)
80.0000 ug | PREFILLED_SYRINGE | INTRAVENOUS | Status: DC | PRN
  Administered 2021-05-19 (×2): 80 ug via INTRAVENOUS

## 2021-05-19 MED ORDER — OXYTOCIN BOLUS FROM INFUSION: 333.0000 mL | Freq: Once | INTRAVENOUS | Status: DC

## 2021-05-19 MED ORDER — FENTANYL CITRATE (PF) 100 MCG/2ML IJ SOLN: 50.0000 ug | INTRAMUSCULAR | Status: DC | PRN

## 2021-05-19 MED ORDER — TERBUTALINE SULFATE 1 MG/ML IJ SOLN: 0.2500 mg | Freq: Once | INTRAMUSCULAR | Status: DC

## 2021-05-19 MED ORDER — ONDANSETRON HCL 4 MG/2ML IJ SOLN
INTRAMUSCULAR | Status: DC | PRN
  Administered 2021-05-19: 4 mg via INTRAVENOUS

## 2021-05-19 MED ORDER — SODIUM CHLORIDE 0.9 % IV SOLN
INTRAVENOUS | Status: DC | PRN
  Administered 2021-05-19: 30 ug/min via INTRAVENOUS

## 2021-05-19 MED ORDER — EPHEDRINE 5 MG/ML INJ: 10.0000 mg | INTRAVENOUS | Status: DC | PRN

## 2021-05-19 MED ORDER — SODIUM CHLORIDE 0.9% FLUSH: 3.0000 mL | INTRAVENOUS | Status: DC | PRN

## 2021-05-19 MED ORDER — PROPOFOL 10 MG/ML IV BOLUS
INTRAVENOUS | Status: AC
  Filled 2021-05-19: qty 20

## 2021-05-19 MED ORDER — OXYTOCIN-SODIUM CHLORIDE 30-0.9 UT/500ML-% IV SOLN
INTRAVENOUS | Status: AC
  Filled 2021-05-19: qty 500

## 2021-05-19 MED ORDER — STERILE WATER FOR IRRIGATION IR SOLN
Status: DC | PRN
  Administered 2021-05-19: 1

## 2021-05-19 MED ORDER — BUPIVACAINE HCL (PF) 0.25 % IJ SOLN
INTRAMUSCULAR | Status: DC | PRN
  Administered 2021-05-19: 5 mL via EPIDURAL

## 2021-05-19 MED ORDER — ONDANSETRON HCL 4 MG/2ML IJ SOLN
INTRAMUSCULAR | Status: AC
  Filled 2021-05-19: qty 2

## 2021-05-19 MED ORDER — LACTATED RINGERS IV SOLN: INTRAVENOUS | Status: DC | PRN

## 2021-05-19 MED ORDER — OXYTOCIN-SODIUM CHLORIDE 30-0.9 UT/500ML-% IV SOLN: 1.0000 m[IU]/min | INTRAVENOUS | Status: DC

## 2021-05-19 MED ORDER — PHENYLEPHRINE 40 MCG/ML (10ML) SYRINGE FOR IV PUSH (FOR BLOOD PRESSURE SUPPORT)
80.0000 ug | PREFILLED_SYRINGE | INTRAVENOUS | Status: AC | PRN
  Administered 2021-05-19 (×3): 80 ug via INTRAVENOUS
  Filled 2021-05-19 (×2): qty 10

## 2021-05-19 MED ORDER — FENTANYL CITRATE (PF) 100 MCG/2ML IJ SOLN
INTRAMUSCULAR | Status: DC | PRN
  Administered 2021-05-19: 15 ug via INTRATHECAL

## 2021-05-19 MED ORDER — FENTANYL CITRATE (PF) 100 MCG/2ML IJ SOLN
INTRAMUSCULAR | Status: AC
  Filled 2021-05-19: qty 2

## 2021-05-19 MED ORDER — EPHEDRINE 5 MG/ML INJ
INTRAVENOUS | Status: AC
  Filled 2021-05-19: qty 5

## 2021-05-19 MED ORDER — ALBUMIN HUMAN 5 % IV SOLN
12.5000 g | Freq: Once | INTRAVENOUS | Status: AC
  Administered 2021-05-19: 12.5 g via INTRAVENOUS
  Filled 2021-05-19: qty 250

## 2021-05-19 MED ORDER — NALBUPHINE HCL 10 MG/ML IJ SOLN: 5.0000 mg | INTRAMUSCULAR | Status: DC | PRN

## 2021-05-19 MED ORDER — OXYTOCIN-SODIUM CHLORIDE 30-0.9 UT/500ML-% IV SOLN
2.5000 [IU]/h | INTRAVENOUS | Status: DC
  Filled 2021-05-19: qty 500

## 2021-05-19 MED ORDER — OXYCODONE HCL 5 MG/5ML PO SOLN
5.0000 mg | Freq: Once | ORAL | Status: DC | PRN
Start: 2021-05-19 — End: 2021-05-19

## 2021-05-19 MED ORDER — ONDANSETRON HCL 4 MG/2ML IJ SOLN: 4.0000 mg | Freq: Four times a day (QID) | INTRAMUSCULAR | Status: DC | PRN

## 2021-05-19 MED ORDER — PHENYLEPHRINE HCL-NACL 20-0.9 MG/250ML-% IV SOLN
INTRAVENOUS | Status: AC
  Filled 2021-05-19: qty 250

## 2021-05-19 MED ORDER — LACTATED RINGERS IV SOLN: 500.0000 mL | Freq: Once | INTRAVENOUS | Status: DC

## 2021-05-19 MED ORDER — ACETAMINOPHEN 325 MG PO TABS
ORAL_TABLET | ORAL | Status: AC
  Filled 2021-05-19: qty 2

## 2021-05-19 MED ORDER — LACTATED RINGERS IV SOLN: 500.0000 mL | INTRAVENOUS | Status: DC | PRN

## 2021-05-19 MED ORDER — ACETAMINOPHEN 325 MG PO TABS
650.0000 mg | ORAL_TABLET | Freq: Once | ORAL | Status: AC
  Administered 2021-05-19: 650 mg via ORAL

## 2021-05-19 MED ORDER — SODIUM CHLORIDE 0.9 % IV SOLN
INTRAVENOUS | Status: AC
  Filled 2021-05-19: qty 500

## 2021-05-19 MED ORDER — TERBUTALINE SULFATE 1 MG/ML IJ SOLN
INTRAMUSCULAR | Status: AC
  Filled 2021-05-19: qty 1

## 2021-05-19 MED ORDER — SOD CITRATE-CITRIC ACID 500-334 MG/5ML PO SOLN
30.0000 mL | ORAL | Status: AC
  Administered 2021-05-19: 30 mL via ORAL

## 2021-05-19 MED ORDER — DIPHENHYDRAMINE HCL 25 MG PO CAPS: 25.0000 mg | ORAL_CAPSULE | ORAL | Status: DC | PRN

## 2021-05-19 MED ORDER — CEFAZOLIN SODIUM-DEXTROSE 2-3 GM-%(50ML) IV SOLR
INTRAVENOUS | Status: DC | PRN
  Administered 2021-05-19: 2 g via INTRAVENOUS

## 2021-05-19 MED ORDER — ACETAMINOPHEN 325 MG PO TABS: 650.0000 mg | ORAL_TABLET | ORAL | Status: DC | PRN

## 2021-05-19 MED ORDER — PHENYLEPHRINE 40 MCG/ML (10ML) SYRINGE FOR IV PUSH (FOR BLOOD PRESSURE SUPPORT)
PREFILLED_SYRINGE | INTRAVENOUS | Status: AC
  Filled 2021-05-19: qty 10

## 2021-05-19 MED ORDER — TRANEXAMIC ACID-NACL 1000-0.7 MG/100ML-% IV SOLN
INTRAVENOUS | Status: AC
  Filled 2021-05-19: qty 100

## 2021-05-19 MED ORDER — TERBUTALINE SULFATE 1 MG/ML IJ SOLN: 0.2500 mg | Freq: Once | INTRAMUSCULAR | Status: DC | PRN

## 2021-05-19 MED ORDER — PHENYLEPHRINE 40 MCG/ML (10ML) SYRINGE FOR IV PUSH (FOR BLOOD PRESSURE SUPPORT)
80.0000 ug | PREFILLED_SYRINGE | INTRAVENOUS | Status: AC | PRN
  Administered 2021-05-19 (×3): 80 ug via INTRAVENOUS
  Filled 2021-05-19: qty 10

## 2021-05-19 MED ORDER — CALCIUM CARBONATE ANTACID 500 MG PO CHEW: 2.0000 | CHEWABLE_TABLET | Freq: Once | ORAL | Status: DC

## 2021-05-19 MED ORDER — PROMETHAZINE HCL 25 MG/ML IJ SOLN: 6.2500 mg | INTRAMUSCULAR | Status: DC | PRN

## 2021-05-19 MED ORDER — FENTANYL CITRATE (PF) 100 MCG/2ML IJ SOLN: 25.0000 ug | INTRAMUSCULAR | Status: DC | PRN

## 2021-05-19 MED ORDER — MORPHINE SULFATE (PF) 0.5 MG/ML IJ SOLN
INTRAMUSCULAR | Status: DC | PRN
  Administered 2021-05-19: 150 ug via INTRATHECAL

## 2021-05-19 MED ORDER — TERBUTALINE SULFATE 1 MG/ML IJ SOLN
0.2500 mg | Freq: Once | INTRAMUSCULAR | Status: AC | PRN
  Administered 2021-05-19: 0.25 mg via SUBCUTANEOUS

## 2021-05-19 MED ORDER — NALOXONE HCL 0.4 MG/ML IJ SOLN: 0.4000 mg | INTRAMUSCULAR | Status: DC | PRN

## 2021-05-19 MED ORDER — OXYTOCIN-SODIUM CHLORIDE 30-0.9 UT/500ML-% IV SOLN
INTRAVENOUS | Status: DC | PRN
Start: 2021-05-19 — End: 2021-05-20
  Administered 2021-05-19: 450 mL via INTRAVENOUS

## 2021-05-19 MED ORDER — TRANEXAMIC ACID-NACL 1000-0.7 MG/100ML-% IV SOLN: 1000.0000 mg | INTRAVENOUS | Status: DC

## 2021-05-19 MED ORDER — SCOPOLAMINE 1 MG/3DAYS TD PT72
1.0000 | MEDICATED_PATCH | Freq: Once | TRANSDERMAL | Status: DC
  Administered 2021-05-20: 1.5 mg via TRANSDERMAL
  Filled 2021-05-19: qty 1

## 2021-05-19 MED ORDER — CEFAZOLIN SODIUM-DEXTROSE 2-4 GM/100ML-% IV SOLN: 2.0000 g | INTRAVENOUS | Status: DC

## 2021-05-19 MED ORDER — PHENYLEPHRINE 40 MCG/ML (10ML) SYRINGE FOR IV PUSH (FOR BLOOD PRESSURE SUPPORT)
80.0000 ug | PREFILLED_SYRINGE | INTRAVENOUS | Status: AC | PRN
  Administered 2021-05-19 (×3): 80 ug via INTRAVENOUS

## 2021-05-19 MED ORDER — OXYCODONE HCL 5 MG PO TABS: 5.0000 mg | ORAL_TABLET | Freq: Once | ORAL | Status: DC | PRN

## 2021-05-19 MED ORDER — LIDOCAINE HCL (PF) 1 % IJ SOLN: 30.0000 mL | INTRAMUSCULAR | Status: DC | PRN

## 2021-05-19 MED ORDER — NALBUPHINE HCL 10 MG/ML IJ SOLN: 5.0000 mg | Freq: Once | INTRAMUSCULAR | Status: DC | PRN

## 2021-05-19 MED ORDER — FENTANYL-BUPIVACAINE-NACL 0.5-0.125-0.9 MG/250ML-% EP SOLN
12.0000 mL/h | EPIDURAL | Status: DC | PRN
  Administered 2021-05-19: 12 mL/h via EPIDURAL
  Filled 2021-05-19 (×2): qty 250

## 2021-05-19 MED ORDER — DIPHENHYDRAMINE HCL 50 MG/ML IJ SOLN: 12.5000 mg | INTRAMUSCULAR | Status: DC | PRN

## 2021-05-19 MED ORDER — TRANEXAMIC ACID-NACL 1000-0.7 MG/100ML-% IV SOLN
INTRAVENOUS | Status: DC | PRN
  Administered 2021-05-19: 1000 mg via INTRAVENOUS

## 2021-05-19 MED ORDER — MISOPROSTOL 25 MCG QUARTER TABLET: 25.0000 ug | ORAL_TABLET | ORAL | Status: DC | PRN

## 2021-05-19 MED ORDER — MORPHINE SULFATE (PF) 0.5 MG/ML IJ SOLN
INTRAMUSCULAR | Status: AC
  Filled 2021-05-19: qty 10

## 2021-05-19 MED ORDER — SODIUM CHLORIDE 0.9 % IR SOLN
Status: DC | PRN
  Administered 2021-05-19: 1

## 2021-05-19 MED ORDER — LACTATED RINGERS IV SOLN
INTRAVENOUS | Status: DC
  Administered 2021-05-19: 125 mL via INTRAVENOUS

## 2021-05-19 MED ORDER — SODIUM CHLORIDE 0.9 % IV SOLN
500.0000 mg | INTRAVENOUS | Status: AC
  Administered 2021-05-19: 500 mg via INTRAVENOUS

## 2021-05-19 MED ORDER — SOD CITRATE-CITRIC ACID 500-334 MG/5ML PO SOLN
30.0000 mL | ORAL | Status: DC | PRN
  Filled 2021-05-19: qty 30

## 2021-05-19 MED ORDER — ONDANSETRON HCL 4 MG/2ML IJ SOLN: 4.0000 mg | Freq: Three times a day (TID) | INTRAMUSCULAR | Status: DC | PRN

## 2021-05-19 MED ORDER — NALOXONE HCL 4 MG/10ML IJ SOLN
1.0000 ug/kg/h | INTRAVENOUS | Status: DC | PRN
  Filled 2021-05-19: qty 5

## 2021-05-19 MED ORDER — BUPIVACAINE IN DEXTROSE 0.75-8.25 % IT SOLN
INTRATHECAL | Status: DC | PRN
  Administered 2021-05-19: 1.4 mL via INTRATHECAL

## 2021-05-19 MED ORDER — KETOROLAC TROMETHAMINE 30 MG/ML IJ SOLN: 30.0000 mg | Freq: Four times a day (QID) | INTRAMUSCULAR | Status: AC | PRN

## 2021-05-19 MED ORDER — LIDOCAINE HCL (PF) 1 % IJ SOLN
INTRAMUSCULAR | Status: DC | PRN
  Administered 2021-05-19: 5 mL via EPIDURAL
  Administered 2021-05-19: 4 mL via EPIDURAL
  Administered 2021-05-19: 10 mL via EPIDURAL

## 2021-05-19 SURGICAL SUPPLY — 28 items
BENZOIN TINCTURE PRP APPL 2/3 (GAUZE/BANDAGES/DRESSINGS) ×2 IMPLANT
CHLORAPREP W/TINT 26 (MISCELLANEOUS) ×4 IMPLANT
CLAMP CORD UMBIL (MISCELLANEOUS) ×2 IMPLANT
CLOSURE STERI-STRIP 1/4X4 (GAUZE/BANDAGES/DRESSINGS) ×2 IMPLANT
CLOTH BEACON ORANGE TIMEOUT ST (SAFETY) ×2 IMPLANT
DERMABOND ADVANCED (GAUZE/BANDAGES/DRESSINGS) ×1
DERMABOND ADVANCED .7 DNX12 (GAUZE/BANDAGES/DRESSINGS) ×1 IMPLANT
DRSG OPSITE POSTOP 4X10 (GAUZE/BANDAGES/DRESSINGS) ×2 IMPLANT
ELECT REM PT RETURN 9FT ADLT (ELECTROSURGICAL) ×2
ELECTRODE REM PT RTRN 9FT ADLT (ELECTROSURGICAL) ×1 IMPLANT
EXTRACTOR VACUUM KIWI (MISCELLANEOUS) ×2 IMPLANT
GLOVE SURG ENC MOIS LTX SZ6 (GLOVE) ×2 IMPLANT
GLOVE SURG UNDER POLY LF SZ7 (GLOVE) ×6 IMPLANT
GOWN STRL REUS W/TWL LRG LVL3 (GOWN DISPOSABLE) ×6 IMPLANT
KIT ABG SYR 3ML LUER SLIP (SYRINGE) ×2 IMPLANT
NEEDLE HYPO 25X5/8 SAFETYGLIDE (NEEDLE) ×2 IMPLANT
NS IRRIG 1000ML POUR BTL (IV SOLUTION) ×2 IMPLANT
PACK C SECTION WH (CUSTOM PROCEDURE TRAY) ×2 IMPLANT
PAD ABD 7.5X8 STRL (GAUZE/BANDAGES/DRESSINGS) ×2 IMPLANT
PAD OB MATERNITY 4.3X12.25 (PERSONAL CARE ITEMS) ×2 IMPLANT
PENCIL SMOKE EVAC W/HOLSTER (ELECTROSURGICAL) ×2 IMPLANT
RTRCTR C-SECT PINK 25CM LRG (MISCELLANEOUS) ×2 IMPLANT
SUT VIC AB 0 CT1 36 (SUTURE) ×6 IMPLANT
SUT VIC AB 2-0 CT1 (SUTURE) ×2 IMPLANT
SUT VIC AB 4-0 KS 27 (SUTURE) ×2 IMPLANT
TOWEL OR 17X24 6PK STRL BLUE (TOWEL DISPOSABLE) ×6 IMPLANT
TRAY FOLEY W/BAG SLVR 14FR LF (SET/KITS/TRAYS/PACK) ×2 IMPLANT
WATER STERILE IRR 1000ML POUR (IV SOLUTION) ×2 IMPLANT

## 2021-05-19 NOTE — Anesthesia Procedure Notes (Signed)
Spinal  Patient location during procedure: OR Start time: 05/19/2021 9:48 PM End time: 05/19/2021 9:51 PM Reason for block: surgical anesthesia Staffing Performed: anesthesiologist  Anesthesiologist: Audry Pili, MD Preanesthetic Checklist Completed: patient identified, IV checked, risks and benefits discussed, surgical consent, monitors and equipment checked, pre-op evaluation and timeout performed Spinal Block Patient position: sitting Prep: DuraPrep Patient monitoring: heart rate, cardiac monitor, continuous pulse ox and blood pressure Approach: midline Location: L2-3 Injection technique: single-shot Needle Needle type: Pencan  Needle gauge: 24 G Additional Notes Consent was obtained prior to the procedure with all questions answered and concerns addressed. Risks including, but not limited to, bleeding, infection, nerve damage, paralysis, failed block, inadequate analgesia, allergic reaction, high spinal, itching, and headache were discussed and the patient wished to proceed. Functioning IV was confirmed and monitors were applied. Sterile prep and drape, including hand hygiene, mask, and sterile gloves were used. The patient was positioned and the spine was prepped. The skin was anesthetized with lidocaine. Free flow of clear CSF was obtained prior to injecting local anesthetic into the CSF. The spinal needle aspirated freely following injection. The needle was carefully withdrawn. The patient tolerated the procedure well.   Laura Don, MD

## 2021-05-19 NOTE — H&P (Signed)
OBSTETRIC ADMISSION HISTORY AND PHYSICAL  Laura [name] is a 35 y.o. female G1P0000 with IUP at 44w2dby LMP c/w early UKoreapresenting for SOL. She reports +FMs, No LOF, no VB, no blurry vision, headaches or peripheral edema, and RUQ pain.  She plans on breast feeding. She is undecided for birth control. [facility] Dating: By LMP c/w early [place]--->  Estimated Date of Delivery: 05/17/21  Sono:    '@[redacted]w[redacted]d'$ , CWD, normal anatomy, breech presentation, posterior placental lie, 265g, 41% EFW  Prenatal History/Complications:  Von Willenbrand's Disease- normal labs in third trimester, requires no medications during delivery. Anemia- on iron supplementation AMA H/o melanoma- send placenta to pathology  Past Medical History: Past Medical History:  Diagnosis Date   History of chicken pox    Melanoma (HHome[place]    left upper arm   Von Willebrand disease (HRollins[place]     Past Surgical History: History reviewed. No pertinent surgical history.  Obstetrical History: OB History     Gravida  1   Para  0   Term  0   Preterm  0   AB  0   Living  0      SAB  0   IAB  0   Ectopic  0   Multiple  0   Live Births              Social History Social History   Socioeconomic History   Marital status: Married    Spouse name: Not on file   Number of children: Not on file   Years of education: Not on file   Highest education level: Not on file  Occupational History   Occupation: teacher    Employer: [employer] Tobacco Use   Smoking status: Never   Smokeless tobacco: Never  Vaping Use   Vaping Use: Never used  Substance and Sexual Activity   Alcohol use: Not Currently   Drug use: No   Sexual activity: Yes    Partners: Male    Birth control/protection: None  Other Topics Concern   Not on file  Social History Narrative   Not on file   Social Determinants of Health   Financial Resource Strain: Not on file  Food Insecurity: Not  on file  Transportation Needs: Not on file  Physical Activity: Not on file  Stress: Not on file  Social Connections: Not on file    Family History: Family History  Problem Relation Age of Onset   Cancer Maternal Grandfather        oral   Heart disease Maternal Grandfather    Alcohol abuse Maternal Grandfather    Breast cancer Paternal Aunt [phone]  Depression Mother    Anxiety disorder Mother    Heart disease Paternal Grandfather     Allergies: Allergies  Allergen Reactions   Codeine Shortness Of Breath    Medications Prior to Admission  Medication Sig Dispense Refill Last Dose   escitalopram (LEXAPRO) 10 MG tablet Take 1 tablet (10 mg total) by mouth daily. 90 tablet 1 05/18/2021   pantoprazole (PROTONIX) 20 MG tablet Take 1 tablet (20 mg total) by mouth daily. You can increase to 2 tabs daily ('40mg'$ ) if 1 tab is not working 60 tablet 1 05/18/2021   Prenatal Vit-Fe Fumarate-FA (MULTIVITAMIN-PRENATAL) 27-0.8 MG TABS tablet Take 1 tablet by mouth daily at 12 noon.   05/18/2021   ferrous sulfate (FERROUSUL) 325 (65 FE) MG  tablet Take 1 tablet (325 mg total) by mouth every other day. (Patient not taking: No sig reported) 60 tablet 1      Review of Systems   All systems reviewed and negative except as stated in HPI  Blood pressure 120/70, pulse 85, temperature 98.3 F (36.8 C), temperature source Oral, resp. rate 18, height '5\' 2"'$  (1.575 m), weight 66.7 kg, last menstrual period 08/10/2020. General appearance: alert, cooperative, appears stated age, and no distress Lungs: clear to auscultation bilaterally Heart: regular rate and rhythm Abdomen: soft, non-tender; bowel sounds normal Pelvic: examined by MAU provider Extremities: Homans sign is negative, no sign of DVT DTR's 2+ Presentation: cephalic- MAU provider Fetal monitoringBaseline: 150 bpm, Variability: Good {> 6 bpm), Accelerations: Reactive, and Decelerations: Absent Uterine activityFrequency: Every 2 minutes Dilation:  5 Effacement (%): 80 Station: -2 Exam by:: Fredda Hammed RN   Prenatal labs: ABO, Rh: O/Positive/-- (02/01 [id number]) Antibody: Negative (02/01 0905) Rubella: 6.10 (02/01 0905) RPR: Non Reactive (06/09 0847)  HBsAg: Negative (02/01 0905)  HIV: Non Reactive (06/09 0847)  GBS: Negative/-- (08/01 1600)  1 hr Glucola WNL 75/146/134 Genetic screening  LR NIPS female Anatomy US WNL  Prenatal Transfer Tool  Maternal Diabetes: No Genetic Screening: Normal Maternal Ultrasounds/Referrals: Normal Fetal Ultrasounds or other Referrals:  None Maternal Substance Abuse:  No Significant Maternal Medications:  None Significant Maternal Lab Results: Group B Strep negative  No results found for this or any previous visit (from the past 24 hour(s)).  Patient Active Problem List   Diagnosis Date Noted   Gastroesophageal reflux disease without esophagitis 04/02/2021   Anemia during pregnancy in third trimester 03/15/2021   Supervision of high risk pregnancy, antepartum 10/24/2020   AMA (advanced maternal age) multigravida 35+ 10/24/2020   GAD (generalized anxiety disorder) 04/27/2018   Melanoma of skin ([place]) 03/18/2017   Von Willebrand's disease ([place]) 08/16/2013    Assessment/Plan:  Laura [name] is a 35 y.o. G1P0000 at 77w2dhere for SOL.  #Labor: Patient is having ctx q220ms with change in cervical dilation from 1>3.5>5 in 2 hours. Will admit and manage expectantly. Can add pitocin if ctx pattern slows down.  #vwD: Does not require any medications in labor. Follow up with hematologist 3 months PP.  #H/o melanoma: placenta to pathology.  #Pain: Epidural #FWB: Cat I  #ID:  GBS neg #MOF: breast #MOC: undecided #Circ:  N/a  CaGladys DammeMD  05/19/2021, 8:58 AM

## 2021-05-19 NOTE — Anesthesia Preprocedure Evaluation (Addendum)
Anesthesia Evaluation  Patient identified by MRN, date of birth, ID band Patient awake    Reviewed: Allergy & Precautions, NPO status , Patient's Chart, lab work & pertinent test results  History of Anesthesia Complications Negative for: history of anesthetic complications  Airway Mallampati: II   Neck ROM: Full    Dental   Pulmonary neg pulmonary ROS,    Pulmonary exam normal        Cardiovascular negative cardio ROS Normal cardiovascular exam     Neuro/Psych PSYCHIATRIC DISORDERS Anxiety negative neurological ROS     GI/Hepatic Neg liver ROS, GERD  Controlled and Medicated,  Endo/Other  negative endocrine ROS  Renal/GU negative Renal ROS     Musculoskeletal negative musculoskeletal ROS (+)   Abdominal   Peds  Hematology  Von Willebrand disease - all lab values normal, no special precautions per hematology Plt 257k    Anesthesia Other Findings Covid test negative   Reproductive/Obstetrics (+) Pregnancy                             Anesthesia Physical Anesthesia Plan  ASA: 2  Anesthesia Plan: Spinal   Post-op Pain Management:    Induction:   PONV Risk Score and Plan: 2 and Treatment may vary due to age or medical condition  Airway Management Planned: Natural Airway  Additional Equipment: None  Intra-op Plan:   Post-operative Plan:   Informed Consent: I have reviewed the patients History and Physical, chart, labs and discussed the procedure including the risks, benefits and alternatives for the proposed anesthesia with the patient or authorized representative who has indicated his/her understanding and acceptance.       Plan Discussed with: Anesthesiologist and CRNA  Anesthesia Plan Comments: (Patient has had epidural most of today, notable for window on left abdomen, which was never fully relieved with boluses. Was contemplating replacing the epidural, however,  patient had a large fetal deceleration. Decision made by OB to proceed to OR for c/s. Discussed with patient. Plan to remove the epidural catheter and perform a spinal given spotty epidural coverage.)       Anesthesia Quick Evaluation

## 2021-05-19 NOTE — Progress Notes (Signed)
Laura [name] is a 35 y.o. G1P0000 at 64w2dadmitted for active labor  Subjective: Patient having some left sided pain with epidural.  Objective: BP (!) 107/55   Pulse (!) 124   Temp 98.6 F (37 C) (Oral)   Resp 16   Ht '5\' 2"'$  (1.575 m)   Wt 66.7 kg   LMP 08/10/2020   SpO2 100%   BMI 26.89 kg/m  No intake/output data recorded. Total I/O In: -  Out: 600 [Urine:600]  FHT:  FHR: 150 bpm, variability: moderate,  accelerations:  Present,  decelerations:  Absent UC:   regular, every 3-4 minutes SVE:   Dilation: 6 Effacement (%): 90 Station: 0 Exam by:: k fields, rn  Labs: Lab Results  Component Value Date   WBC 15.7 (H) 05/19/2021   HGB 12.6 05/19/2021   HCT 38.4 05/19/2021   MCV 94.8 05/19/2021   PLT 257 05/19/2021    Assessment / Plan: Spontaneous labor, has not progressed since last check.  Labor: Patient to have epidural redosed and then when pain better controlled, will start pitocin and titrate. Preeclampsia:   n/a Fetal Wellbeing:  Category I Pain Control:  Epidural I/D:   GBS neg Anticipated MOD:  NSVD  CGladys Damme8/27/2022, 5:49 PM

## 2021-05-19 NOTE — Progress Notes (Signed)
Marked variability noted on FHT, now at baseline, moderate variability, + accels, no decels. Will monitor for at least 30 minutes after marked variability before starting pitocin.  Gladys Damme, MD [place] [facility], PGY-3

## 2021-05-19 NOTE — Progress Notes (Addendum)
Laura [name] is a 35 y.o. G1P0000 at 42w2dadmitted for active labor  Subjective: Patient had one prolonged late deceleration after epidural. She is much more comfortable after receiving epidural  Objective: BP (!) 119/59   Pulse (!) 139   Temp 99 F (37.2 C) (Oral)   Resp 18   Ht '5\' 2"'$  (1.575 m)   Wt 66.7 kg   LMP 08/10/2020   SpO2 100%   BMI 26.89 kg/m  No intake/output data recorded. No intake/output data recorded.  FHT:  FHR: 150 bpm, variability: minimal ,  accelerations:  Abscent,  decelerations:  Absent UC:   regular, every 3-4 minutes SVE:   Dilation: 6 Effacement (%): 90 Station: 0 Exam by:: Dr. DDamita Dunnings Labs: Lab Results  Component Value Date   WBC 15.7 (H) 05/19/2021   HGB 12.6 05/19/2021   HCT 38.4 05/19/2021   MCV 94.8 05/19/2021   PLT 257 05/19/2021    Assessment / Plan: Spontaneous labor, progressing normally  Labor: Progressing normally. Patient had large deceleration after receiving epidural. She had tachysystole from uterine ctx and low BP (80s/50s). Terb given and FSE placed, fetal tracing improved to tachycardia. Will continue to monitor closely. Will give bolus. Continue expectant management. Preeclampsia:   n/a Fetal Wellbeing:  Category II Pain Control:  Epidural I/D:   GBS neg Anticipated MOD:  NSVD  CGladys Damme8/27/2022, 1:35 PM

## 2021-05-19 NOTE — Anesthesia Procedure Notes (Addendum)
Epidural Patient location during procedure: OB Start time: 05/19/2021 11:51 AM End time: 05/19/2021 11:55 AM  Staffing Anesthesiologist: Audry Pili, MD Performed: anesthesiologist   Preanesthetic Checklist Completed: patient identified, IV checked, risks and benefits discussed, monitors and equipment checked, pre-op evaluation and timeout performed  Epidural Patient position: sitting Prep: DuraPrep Patient monitoring: continuous pulse ox and blood pressure Approach: midline Location: L2-L3 Injection technique: LOR saline  Needle:  Needle type: Tuohy  Needle gauge: 17 G Needle length: 9 cm Needle insertion depth: 5 cm Catheter size: 19 Gauge Catheter at skin depth: 10 cm Test dose: negative and Other (1% lidocaine)  Assessment Events: blood not aspirated  Additional Notes Patient identified. Risks including, but not limited to, bleeding, infection, nerve damage, paralysis, inadequate analgesia, blood pressure changes, nausea, vomiting, allergic reaction, postpartum back pain, itching, and headache were discussed. Patient expressed understanding and wished to proceed. Sterile prep and drape, including hand hygiene, mask, and sterile gloves were used. The patient was positioned and the spine was prepped. The skin was anesthetized with lidocaine. No paraesthesia or other complication noted. The patient did not experience any signs of intravascular injection such as tinnitus or metallic taste in mouth, nor signs of intrathecal spread such as rapid motor block. Please see nursing notes for vital signs. The patient tolerated the procedure well.   Renold Don, MDReason for block:procedure for pain

## 2021-05-19 NOTE — Discharge Summary (Addendum)
Postpartum Discharge Summary     Patient Name: Laura [name] DOB: 02-24-1986 MRN: [id number]  Date of admission: 05/19/2021 Delivery date:05/19/2021  Delivering provider: Radene Gunning  Date of discharge: 05/21/2021  Admitting diagnosis: Supervision of normal pregnancy [Z34.90] Intrauterine pregnancy: [redacted]w[redacted]d    Secondary diagnosis:  Principal Problem:   Cesarean delivery delivered Active Problems:   Von Willebrand's disease ([place]   [place] of [place] ([place]   GAD (generalized anxiety disorder)   AMA (advanced maternal age) multigravida 35+   Anemia during pregnancy in third trimester   Gastroesophageal reflux disease without esophagitis  Additional problems: None    Discharge diagnosis: Term Pregnancy Delivered                                              Post partum procedures: none Augmentation:  None Complications: None  Hospital course: Onset of Labor With Vaginal Delivery      35y.o. yo G1P1001 at 451w2das admitted in Latent Labor on 05/19/2021. Patient had an uncomplicated labor course as follows:  Membrane Rupture Time/Date: 10:11 PM ,05/19/2021   Delivery Method:C-Section, Low Transverse  Episiotomy: None  Lacerations:  None  Patient had an uncomplicated postpartum course.  She is ambulating, tolerating a regular diet, passing flatus, and urinating well. Patient is discharged home in stable condition on 05/21/21.  Newborn Data: Birth date:05/19/2021  Birth time:10:12 PM  Gender:Female  Living status:Living  Apgars:9 ,9  Weight:3504 g   Magnesium Sulfate received: No BMZ received: No Rhophylac:N/A MMR:N/A - Immune T-DaP:Given prenatally Flu: N/A - Given prenatally Transfusion:No  Physical exam  Vitals:   05/20/21 1000 05/20/21 1500 05/21/21 0030 05/21/21 0609  BP:  (!) 92/51 (!) 90/57 92/62  Pulse:  93 85 76  Resp: '16 16 18 20  ' Temp: 97.7 F (36.5 C) 97.8 F (36.6 C) 98.2 F (36.8 C) 99.7 F (37.6 C)  TempSrc: Oral Oral Oral Oral  SpO2: 97% 96%  100% 100%  Weight:      Height:       General: alert, cooperative, and no distress Lochia: appropriate Uterine Fundus: firm Incision: Healing well with no significant drainage DVT Evaluation: No evidence of DVT seen on physical exam. Labs: Lab Results  Component Value Date   WBC 15.8 (H) 05/20/2021   HGB 8.7 (L) 05/20/2021   HCT 26.5 (L) 05/20/2021   MCV 95.7 05/20/2021   PLT 201 05/20/2021   CMP Latest Ref Rng & Units 04/05/2021  Glucose 70 - 99 mg/dL 114(H)  BUN 6 - 20 mg/dL 5(L)  Creatinine 0.44 - 1.00 mg/dL 0.64  Sodium 135 - 145 mmol/L 137  Potassium 3.5 - 5.1 mmol/L 3.5  Chloride 98 - 111 mmol/L 106  CO2 22 - 32 mmol/L 23  Calcium 8.9 - 10.3 mg/dL 9.2  Total Protein 6.5 - 8.1 g/dL 6.7  Total Bilirubin 0.3 - 1.2 mg/dL 0.3  Alkaline Phos 38 - 126 U/L 132(H)  AST 15 - 41 U/L 14(L)  ALT 0 - 44 U/L 12   Edinburgh Score: No flowsheet data found.   After visit meds:  Allergies as of 05/21/2021       Reactions   Codeine Shortness Of Breath   Not anaphylaxis        Medication List     TAKE these medications    acetaminophen 500 MG tablet Commonly  known as: TYLENOL Take 2 tablets (1,000 mg total) by mouth every 6 (six) hours.   escitalopram 10 MG tablet Commonly known as: LEXAPRO Take 1 tablet (10 mg total) by mouth daily.   ferrous sulfate 325 (65 FE) MG tablet Commonly known as: FerrouSul Take 1 tablet (325 mg total) by mouth every other day.   ibuprofen 200 MG tablet Commonly known as: ADVIL Take 3 tablets (600 mg total) by mouth every 6 (six) hours.   multivitamin-prenatal 27-0.8 MG Tabs tablet Take 1 tablet by mouth daily at 12 noon.   oxyCODONE 5 MG immediate release tablet Commonly known as: Oxy IR/ROXICODONE Take 1-2 tablets (5-10 mg total) by mouth every 4 (four) hours as needed for moderate pain.   pantoprazole 20 MG tablet Commonly known as: Protonix Take 1 tablet (20 mg total) by mouth daily. You can increase to 2 tabs daily (43m) if  1 tab is not working   senna-docusate 8.6-50 MG tablet Commonly known as: Senokot-S Take 2 tablets by mouth daily.         Discharge home in stable condition Infant Feeding: Breast Infant Disposition:home with mother Discharge instruction: per After Visit Summary and Postpartum booklet. Activity: Advance as tolerated. Pelvic rest for 6 weeks.  Diet: routine diet Future Appointments: Future Appointments  Date Time Provider DArroyo 08/10/2021  7:45 AM [facility] None  08/10/2021  8:40 AM [facility]   Follow up Visit: [facility]wenlyn [facility]kingto [facility]21.  Please schedule this patient for a In person postpartum visit in 4 weeks with the following provider: MD. Additional Postpartum F/U:Incision check 1 week  High risk pregnancy complicated by:  AMA, VWD Delivery mode:  C-Section, Low Transverse  Anticipated Birth Control:  Unsure, plan to have decision at 1 week incision check    05/21/2021 BPearla Dubonnet MD  Attestation:  I confirm that I have verified the information documented in the resident's note and that I have also personally reperformed the physical exam and all medical decision making activities.   Patient was seen and examined by me also Agree with note Vitals stable Labs stable Fundus firm, lochia within normal limits Incision healing Ext WNL Ready for discharge  WSeabron Spates CNM

## 2021-05-19 NOTE — Op Note (Signed)
[place]  PROCEDURE DATE: 05/19/2021  PREOPERATIVE DIAGNOSES: Intrauterine pregnancy at 48w2dweeks gestation; non-reassuring fetal status  POSTOPERATIVE DIAGNOSES: The same  PROCEDURE: Primary Low Transverse Cesarean Section  SURGEON:  Dr. PRadene Gunning ASSISTANT:  Dr. CVilma [name] ANESTHESIOLOGY TEAM: Anesthesiologist: BAudry Pili MD CRNA: WAdalberto Ill CRNA  INDICATIONS: Laura [name] a 35y.o. G1P1001 at 432w2dere for cesarean section secondary to the indications listed under preoperative diagnoses; please see preoperative note for further details.  The risks of surgery were discussed with the patient including but were not limited to: bleeding which may require transfusion or reoperation; infection which may require antibiotics; injury to bowel, bladder, ureters or other surrounding organs; injury to the fetus; need for additional procedures including hysterectomy in the event of a life-threatening hemorrhage; formation of adhesions; placental abnormalities wth subsequent pregnancies; incisional problems; thromboembolic phenomenon and other postoperative/anesthesia complications.  The patient concurred with the proposed plan, giving informed written consent for the procedure.    FINDINGS:  Viable female infant in cephalic presentation.  Apgars 9 and 9.  Amniotic fluid: meconium.  Intact placenta, three vessel cord.  Normal uterus, fallopian tubes and ovaries bilaterally.  ANESTHESIA: epidural INTRAVENOUS FLUIDS: 1000 ml   ESTIMATED BLOOD LOSS: 287 ml URINE OUTPUT:  250 ml SPECIMENS: Placenta sent to pathology due to history of melanoma COMPLICATIONS: None immediate  PROCEDURE IN DETAIL:  The patient preoperatively received intravenous antibiotics and had sequential compression devices applied to her lower extremities.  She was then taken to the operating room where the epidural was dosed up to a surgical level and found to be adequate. She was then placed in  a dorsal supine position with a leftward tilt, and prepped and draped in a sterile manner.  A foley catheter was in place and attached to constant gravity.  After an adequate timeout was performed, a Pfannenstiel skin incision was made with scalpel and carried through to the underlying layer of fascia. The fascia was incised in the midline, and this incision was extended bluntly. The rectus muscles were separated in the midline and the peritoneum was entered bluntly.   The Alexis self-retaining retractor was introduced into the abdominal cavity.  Attention was turned to the lower uterine segment where a low transverse hysterotomy was made with a scalpel and extended bilaterally bluntly.  The infant was successfully delivered, the cord was clamped and cut after one minute, and the infant was handed over to the awaiting neonatology team. Uterine massage was then administered, and the placenta delivered intact with a three-vessel cord. The uterus was then cleared of clots and debris.  The hysterotomy was closed with 0 Vicryl in a running locked fashion, and an imbricating layer was also placed with 0 Vicryl.   The pelvis was cleared of all clot and debris. Hemostasis was confirmed on all surfaces.  The retractor was removed.  The peritoneum was closed with a 2-0 Vicryl running stitch. The fascia was then closed using 0 Vicryl in a running fashion.  The subcutaneous layer was irrigated, any areas of bleeding were cauterized with the bovie. The skin was closed with a 4-0 Vicryl subcuticular stitch. The patient tolerated the procedure well. Sponge, instrument and needle counts were correct x 3.  She was taken to the recovery room in stable condition.   Laura MeckelMD OB Fellow  Faculty Practice

## 2021-05-19 NOTE — MAU Note (Signed)
PT SAYS UC STRONG SINCE 0240 PNC WITH [place] VE LAST Monday- 1 CM DENIES HSV GBS- NEG

## 2021-05-19 NOTE — Progress Notes (Signed)
Labor Progress Note Laura [name] is a 35 y.o. G1P0000 at 42w2dpresented for labor S: She is still having issues with pain with her contractions.   O:  BP (!) 92/39   Pulse (!) 110   Temp 98.7 F (37.1 C) (Oral)   Resp 15   Ht '5\' 2"'$  (1.575 m)   Wt 66.7 kg   LMP 08/10/2020   SpO2 100%   BMI 26.89 kg/m  EFM: 150, min-mod variability, episodes of marked variability, recurrent late decels Toco Q2-4 min  CVE: Dilation: 6 Dilation Complete Date: 05/19/21 Dilation Complete Time: 2050 Effacement (%): 90 Cervical Position: Posterior Station: 0 Presentation: Vertex Exam by:: Dr. DDamita Dunnings  A&P: 35y.o. G1P0000 448w2dn labor - Discussed and recommended c-section for persistent category 2 tracing despite intra-uterine resuscitative efforts. Discussed the risks of c-section which include but are not limited to bleeding, infection, injury to surrounding organs/tissues (namely, the bowel/bladder), need for additional surgery, wound complications, greater risks of surgery associated with repeat c-section.  PaRadene GunningMD 9:22 PM

## 2021-05-20 ENCOUNTER — Encounter (HOSPITAL_COMMUNITY): Payer: Self-pay | Admitting: Obstetrics and Gynecology

## 2021-05-20 LAB — CBC
HCT: 26.5 % — ABNORMAL LOW (ref 36.0–46.0)
Hemoglobin: 8.7 g/dL — ABNORMAL LOW (ref 12.0–15.0)
MCH: 31.4 pg (ref 26.0–34.0)
MCHC: 32.8 g/dL (ref 30.0–36.0)
MCV: 95.7 fL (ref 80.0–100.0)
Platelets: 201 10*3/uL (ref 150–400)
RBC: 2.77 MIL/uL — ABNORMAL LOW (ref 3.87–5.11)
RDW: 15.9 % — ABNORMAL HIGH (ref 11.5–15.5)
WBC: 15.8 10*3/uL — ABNORMAL HIGH (ref 4.0–10.5)
nRBC: 0 % (ref 0.0–0.2)

## 2021-05-20 MED ORDER — FERROUS SULFATE 325 (65 FE) MG PO TABS
325.0000 mg | ORAL_TABLET | ORAL | Status: DC
  Administered 2021-05-20 – 2021-05-22 (×2): 325 mg via ORAL
  Filled 2021-05-20 (×2): qty 1

## 2021-05-20 MED ORDER — WITCH HAZEL-GLYCERIN EX PADS: 1.0000 "application " | MEDICATED_PAD | CUTANEOUS | Status: DC | PRN

## 2021-05-20 MED ORDER — MENTHOL 3 MG MT LOZG: 1.0000 | LOZENGE | OROMUCOSAL | Status: DC | PRN

## 2021-05-20 MED ORDER — PANTOPRAZOLE SODIUM 20 MG PO TBEC
20.0000 mg | DELAYED_RELEASE_TABLET | Freq: Every day | ORAL | Status: DC
  Administered 2021-05-20 – 2021-05-22 (×3): 20 mg via ORAL
  Filled 2021-05-20 (×3): qty 1

## 2021-05-20 MED ORDER — MEASLES, MUMPS & RUBELLA VAC IJ SOLR: 0.5000 mL | Freq: Once | INTRAMUSCULAR | Status: DC

## 2021-05-20 MED ORDER — DIBUCAINE (PERIANAL) 1 % EX OINT: 1.0000 "application " | TOPICAL_OINTMENT | CUTANEOUS | Status: DC | PRN

## 2021-05-20 MED ORDER — MEDROXYPROGESTERONE ACETATE 150 MG/ML IM SUSP: 150.0000 mg | INTRAMUSCULAR | Status: DC | PRN

## 2021-05-20 MED ORDER — PRENATAL MULTIVITAMIN CH
1.0000 | ORAL_TABLET | Freq: Every day | ORAL | Status: DC
  Administered 2021-05-20 – 2021-05-22 (×3): 1 via ORAL
  Filled 2021-05-20 (×3): qty 1

## 2021-05-20 MED ORDER — LACTATED RINGERS IV SOLN: INTRAVENOUS | Status: DC

## 2021-05-20 MED ORDER — OXYTOCIN-SODIUM CHLORIDE 30-0.9 UT/500ML-% IV SOLN: 2.5000 [IU]/h | INTRAVENOUS | Status: AC

## 2021-05-20 MED ORDER — ACETAMINOPHEN 500 MG PO TABS
1000.0000 mg | ORAL_TABLET | Freq: Four times a day (QID) | ORAL | Status: AC
  Administered 2021-05-20 – 2021-05-22 (×8): 1000 mg via ORAL
  Filled 2021-05-20 (×8): qty 2

## 2021-05-20 MED ORDER — TETANUS-DIPHTH-ACELL PERTUSSIS 5-2.5-18.5 LF-MCG/0.5 IM SUSY: 0.5000 mL | PREFILLED_SYRINGE | Freq: Once | INTRAMUSCULAR | Status: DC

## 2021-05-20 MED ORDER — SIMETHICONE 80 MG PO CHEW
80.0000 mg | CHEWABLE_TABLET | Freq: Three times a day (TID) | ORAL | Status: DC
  Administered 2021-05-20 – 2021-05-22 (×7): 80 mg via ORAL
  Filled 2021-05-20 (×7): qty 1

## 2021-05-20 MED ORDER — COCONUT OIL OIL
1.0000 "application " | TOPICAL_OIL | Status: DC | PRN
  Administered 2021-05-21: 1 via TOPICAL

## 2021-05-20 MED ORDER — IBUPROFEN 600 MG PO TABS
600.0000 mg | ORAL_TABLET | Freq: Four times a day (QID) | ORAL | Status: DC
  Administered 2021-05-21 – 2021-05-22 (×7): 600 mg via ORAL
  Filled 2021-05-20 (×7): qty 1

## 2021-05-20 MED ORDER — SENNOSIDES-DOCUSATE SODIUM 8.6-50 MG PO TABS
2.0000 | ORAL_TABLET | Freq: Every day | ORAL | Status: DC
  Administered 2021-05-20 – 2021-05-22 (×3): 2 via ORAL
  Filled 2021-05-20 (×3): qty 2

## 2021-05-20 MED ORDER — SIMETHICONE 80 MG PO CHEW: 80.0000 mg | CHEWABLE_TABLET | ORAL | Status: DC | PRN

## 2021-05-20 MED ORDER — KETOROLAC TROMETHAMINE 30 MG/ML IJ SOLN
30.0000 mg | Freq: Four times a day (QID) | INTRAMUSCULAR | Status: AC
  Administered 2021-05-20 (×4): 30 mg via INTRAVENOUS
  Filled 2021-05-20 (×4): qty 1

## 2021-05-20 MED ORDER — ESCITALOPRAM OXALATE 10 MG PO TABS
10.0000 mg | ORAL_TABLET | Freq: Every day | ORAL | Status: DC
  Administered 2021-05-20 – 2021-05-22 (×3): 10 mg via ORAL
  Filled 2021-05-20 (×3): qty 1

## 2021-05-20 MED ORDER — DIPHENHYDRAMINE HCL 25 MG PO CAPS: 25.0000 mg | ORAL_CAPSULE | Freq: Four times a day (QID) | ORAL | Status: DC | PRN

## 2021-05-20 MED ORDER — OXYCODONE HCL 5 MG PO TABS: 5.0000 mg | ORAL_TABLET | ORAL | Status: DC | PRN

## 2021-05-20 MED ORDER — ENOXAPARIN SODIUM 40 MG/0.4ML IJ SOSY
40.0000 mg | PREFILLED_SYRINGE | INTRAMUSCULAR | Status: DC
  Administered 2021-05-20 – 2021-05-22 (×3): 40 mg via SUBCUTANEOUS
  Filled 2021-05-20 (×3): qty 0.4

## 2021-05-20 NOTE — Progress Notes (Addendum)
MOB was referred for history of depression/anxiety. * Referral screened out by Clinical Social Worker because none of the following criteria appear to apply:  ~ History of anxiety/depression during this pregnancy, or of post-partum depression following prior delivery. ~ Diagnosis of anxiety and/or depression within last 3 years OR * MOB's symptoms currently being treated with medication and/or therapy. Per chart, MOB's symptoms are currently being treated with Lexapro.   Please contact the Clinical Social Worker if needs arise, by [facility] request, or if MOB scores greater than 9 or yes to question 10 on Edinburgh Postpartum Depression Screen.    Darcus Austin, MSW, LCSW-A Clinical Social Worker- Weekends [phone]

## 2021-05-20 NOTE — Progress Notes (Signed)
POSTPARTUM PROGRESS NOTE  POD #1  Subjective:  Laura [name] is a 35 y.o. G1P1001 s/p pLTCS at 60w2dfor NRFHTs.  She reports she doing well. No acute events overnight. She has not ambulated or urinated yet (foley still in place). Denies nausea or vomiting. Pain is well controlled.  Lochia is mild. Eating and drinking without concern.   Objective: Blood pressure (!) 99/51, pulse 91, temperature 98 F (36.7 C), temperature source Oral, resp. rate 19, height '5\' 2"'$  (1.575 m), weight 66.7 kg, last menstrual period 08/10/2020, SpO2 94 %, unknown if currently breastfeeding.  Physical Exam:  General: alert, cooperative and no distress Chest: no respiratory distress Heart:regular rate, distal pulses intact Uterine Fundus: firm, appropriately tender DVT Evaluation: No calf swelling or tenderness Extremities: minimal edema Skin: warm, dry; incision clean/dry/intact w/ honeycomb dressing in place, small region of saturation centrally   Recent Labs    05/19/21 0905 05/20/21 0529  HGB 12.6 8.7*  HCT 38.4 26.5*    Assessment/Plan: Laura MCCARRONis a 35y.o. G1P1001 s/p pLTCS at 447w2dor NRFHTs.  POD#1 - Doing welll; pain well controlled.   Routine postpartum care  Lovenox for VTE prophylaxis Acute blood loss Anemia: Asymptomatic. Hgb 8.7 (admit 12.6, however remained of hgb including one month prior around baseline of 9-10 and EBL 28038m Start po ferrous sulfate, can consider IV venofer if becomes symptomatic with increased movement etc.    Contraception: undecided  Feeding: breast  Dispo: Plan for discharge tomorrow or following day.   LOS: 1 day   SamDarrelyn HillockO  OB Fellow  05/20/2021, 8:51 AM

## 2021-05-20 NOTE — Lactation Note (Signed)
This note was copied from a baby's chart. Lactation Consultation Note  Patient Name: Laura [name] [id number] Date: 05/20/2021 Reason for consult: Initial assessment;Term;1st time breastfeeding (C/S delivery) Age:36 hours LC entered the room, mom was attempting to latch infant at the breast, mom latched infant on her right breast using the football hold position, infant was off and on breast, breastfeeding for 8 minutes. Afterwards mom hand expressed and infant was given 8 mls of colostrum by spoon. LC discussed infant's input and output with parents. Mom made aware of O/P services, breastfeeding support groups, community resources, and our phone # for post-discharge questions.   Mom's plans: 1- Mom will breastfeed infant according to cues, 8 to 12 or more times within 24 hours, skin to skin. 2- Mom was ask for latch assistance from RN or LC if needed. 3- Mom will continue to work towards latching infant at the breast, if infant doesn't latch, mom will hand express and give infant back volume.  Maternal Data Has patient been taught Hand Expression?: Yes Does the patient have breastfeeding experience prior to this delivery?: No  Feeding Mother's Current Feeding Choice: Breast Milk  LATCH Score Latch: Repeated attempts needed to sustain latch, nipple held in mouth throughout feeding, stimulation needed to elicit sucking reflex.  Audible Swallowing: A few with stimulation  Type of Nipple: Everted at rest and after stimulation  Comfort (Breast/Nipple): Soft / non-tender  Hold (Positioning): Assistance needed to correctly position infant at breast and maintain latch.  LATCH Score: 7   Lactation Tools Discussed/Used    Interventions Interventions: Breast feeding basics reviewed;Skin to skin;Assisted with latch;Breast massage;Hand express;Expressed milk;Position options;Support pillows;Adjust position;Breast compression;Education  Discharge Pump: Personal WIC Program:  No  Consult Status Consult Status: Follow-up Date: 05/21/21 Follow-up type: In-patient    Vicente Serene 05/20/2021, 2:13 AM

## 2021-05-20 NOTE — Transfer of Care (Signed)
Immediate Anesthesia Transfer of Care Note  Patient: Laura [name]  Procedure(s) Performed: CESAREAN SECTION (Bilateral)  Patient Location: PACU  Anesthesia Type:Spinal  Level of Consciousness: awake, alert , oriented and patient cooperative  Airway & Oxygen Therapy: Patient Spontanous Breathing  Post-op Assessment: Report given to RN and Post -op Vital signs reviewed and stable  Post vital signs: Reviewed and stable  Last Vitals:  Vitals Value Taken Time  BP 99/51 05/20/21 0347  Temp 36.7 C 05/20/21 0130  Pulse 91 05/20/21 0347  Resp 19 05/20/21 0347  SpO2 94 % 05/20/21 0347    Last Pain:  Vitals:   05/20/21 0347  TempSrc:   PainSc: 0-No pain      Patients Stated Pain Goal: 0 ([id number])  Complications: No notable events documented.

## 2021-05-20 NOTE — Lactation Note (Signed)
This note was copied from a baby's chart. Lactation Consultation Note  Patient Name: Laura [name] [id number] Date: 05/20/2021 Reason for consult: Follow-up assessment;1st time breastfeeding;Primapara;Term +DAT Age:36 hours  LC in to visit with P1 Mom of term baby.  Baby has latched several times per Mom and sucking for a few minutes.  Mom has also hand expressed drops of colostrum and spoon fed to baby.    Baby currently sleeping on Mom's chest STS following bath.    LC offered to assist and assisted with positioning baby over the breast.  Baby did not awaken to latch to the breast.  Mom encouraged to keep baby STS and to call when baby starts showing feeding cue to assist with latching to the breast.     Interventions Interventions: Breast feeding basics reviewed;Assisted with latch;Skin to skin;Breast massage;Hand express;Breast compression;Adjust position;Support pillows;Position options;Hand pump  Discharge WIC Program: No  Consult Status Consult Status: Follow-up Date: 05/21/21 Follow-up type: In-patient    Broadus John 05/20/2021, 3:56 PM

## 2021-05-20 NOTE — Lactation Note (Signed)
This note was copied from a baby's chart. Lactation Consultation Note  Patient Name: Girl Rylynn Kobs [id number]'O Date: 05/20/2021   Age:36 hours, term female infant, -1% weight loss. Mom understands infant may start cluster feeding and this is normal behavior on day 2 of life.  LC entered the room ,infant  is asleep in bassinet per mom, infant recently breastfeed. Infant is DAT+ but last TCB was low. Mom has DEBP set up in room but her preference is using the manual hand pump from DEBP kit, she is seeing colostrum and giving it back to infant for extra volume after latching infant at the breast. Mom will use DEBP if infant TCB becomes high but currently prefers only to use hand pump. Per mom, infant is breastfeeding well now, sustaining latch and most feedings are 10 minutes or longer in length.  Mom knows to ask RN for assistance with latching infant at the breast if needed tonight.  Maternal Data    Feeding    LATCH Score                    Lactation Tools Discussed/Used    Interventions    Discharge    Consult Status      Vicente Serene 05/20/2021, 10:41 PM

## 2021-05-20 NOTE — Anesthesia Postprocedure Evaluation (Signed)
Anesthesia Post Note  Patient: NAYELIZ DECORDOVA  Procedure(s) Performed: CESAREAN SECTION (Bilateral)     Patient location during evaluation: PACU Anesthesia Type: Spinal and Epidural Level of consciousness: awake and alert Pain management: pain level controlled Vital Signs Assessment: post-procedure vital signs reviewed and stable Respiratory status: spontaneous breathing and respiratory function stable Cardiovascular status: blood pressure returned to baseline and stable Postop Assessment: spinal receding, no apparent nausea or vomiting and epidural receding Anesthetic complications: no   No notable events documented.  Last Vitals:  Vitals:   05/20/21 0011 05/20/21 0032  BP:  (!) 102/55  Pulse:  95  Resp:  17  Temp: 37.7 C 37.3 C  SpO2:  96%    Last Pain:  Vitals:   05/20/21 0032  TempSrc: Oral  PainSc:    Pain Goal: Patients Stated Pain Goal: 0 (05/19/21 0645)                 Audry Pili

## 2021-05-21 ENCOUNTER — Inpatient Hospital Stay (HOSPITAL_COMMUNITY)
Admission: AD | Admit: 2021-05-21 | Payer: BC Managed Care – PPO | Source: Home / Self Care | Admitting: Obstetrics & Gynecology

## 2021-05-21 ENCOUNTER — Inpatient Hospital Stay (HOSPITAL_COMMUNITY): Payer: BC Managed Care – PPO

## 2021-05-21 MED ORDER — SENNOSIDES-DOCUSATE SODIUM 8.6-50 MG PO TABS: 2.0000 | ORAL_TABLET | Freq: Every day | ORAL | 0 refills | Status: DC

## 2021-05-21 MED ORDER — IBUPROFEN 200 MG PO TABS: 600.0000 mg | ORAL_TABLET | Freq: Four times a day (QID) | ORAL | Status: DC

## 2021-05-21 MED ORDER — OXYCODONE HCL 5 MG PO TABS: 5.0000 mg | ORAL_TABLET | ORAL | 0 refills | Status: DC | PRN

## 2021-05-21 MED ORDER — ACETAMINOPHEN 500 MG PO TABS: 1000.0000 mg | ORAL_TABLET | Freq: Four times a day (QID) | ORAL | 0 refills | Status: DC

## 2021-05-21 NOTE — Lactation Note (Addendum)
This note was copied from a baby's chart. Lactation Consultation Note  Patient Name: Laura [name] [id number] Date: 05/21/2021 Reason for consult: Follow-up assessment;Mother's request;Difficult latch;Term Age:36 hours  LC reviewed with Mom breastfeeding supplementation volume when infant latching at the breast. Mom able to increase volume after breastfeeding for last 2 feedings.   Plan 1. To feed 8-12x  24hr period. Mom to offer breasts and look for signs of milk transfer.  2. Mom to supplement based on guidelines provided with EBM first followed by [facility] with pace bottle feeding. Mom aware to increase volume as tolerated and offer more if infant not latching at the breast.  3. Mom to pump with DEBP q 3 hrs for 15 min 4 Mom to use EBM and coconut oil for nipple care.  [place] talked with RN, Laura [name], Mom has sore nipples and her next feeding set for 7 pm to observe a latch or call for [facility] assistance to work with her. Mom denied any pain with use of 24 flange following set up and review with RN.    Maternal Data    Feeding Mother's Current Feeding Choice: Breast Milk and Donor Milk Nipple Type: Slow - flow  LATCH Score                    Lactation Tools Discussed/Used Tools: Pump;Flanges;Coconut oil (RN, Laura [name] to provide coconut oil for nipple care. Mom nipples little red but not tender to touch according to mother. Mom to use coconut oil and EBM for nipple care.) Flange Size: 24 Breast pump type: Double-Electric Breast Pump Pump Education: Setup, frequency, and cleaning;Milk Storage Reason for Pumping: increase stimulation (LC reviewed with Mom possible d/c tomorrow only able to pump once today getting 5 ml. Mom will work on pumping  with dEBP q 3 hrs for 15 to increase her milk supply.) Pumping frequency: every 3 hrs for 15 min  Interventions Interventions: Breast feeding basics reviewed;Education;Pace feeding;Expressed milk;Coconut oil;Hand  express;Breast massage;Breast compression;DEBP (Mom to increase feeding volume following latching at the breast. Mom aware to offer more if infant not latching. BF supplementation volume guide provided based on hrs of age since delivery.)  Discharge    Consult Status Consult Status: Follow-up Date: 05/22/21 Follow-up type: In-patient    Laura [name]  [name] 05/21/2021, 6:07 PM

## 2021-05-21 NOTE — Progress Notes (Signed)
Patient is POD2 after c/d. Infant must stay tonight. Will cancel d/c order and patient can be discharged tomorrow.  Gladys Damme, MD [facility], PGY-3

## 2021-05-21 NOTE — Lactation Note (Signed)
This note was copied from a baby's chart. Lactation Consultation Note  Patient Name: Girl Uniquia Glime [id number] Date: 05/21/2021 Reason for consult: Follow-up assessment Age:36 hours   LC Follow Up Note:  RN requested a lactation visit.  Family asleep at this time; will return later today.   Maternal Data    Feeding Nipple Type: Slow - flow  LATCH Score                    Lactation Tools Discussed/Used    Interventions    Discharge    Consult Status Consult Status: Follow-up Date: 05/21/21 Follow-up type: In-patient    Adylynn Hertenstein R Gerianne Simonet 05/21/2021, 7:18 AM

## 2021-05-22 LAB — SURGICAL PATHOLOGY

## 2021-05-22 NOTE — Discharge Summary (Addendum)
Postpartum Discharge Summary     Patient Name: Laura [name] DOB: September 09, 1986 MRN: [id number]  Date of admission: 05/19/2021 Delivery date:05/19/2021  Delivering provider: Radene Gunning  Date of discharge: 05/22/2021  Admitting diagnosis: Supervision of normal pregnancy [Z34.90] Intrauterine pregnancy: [redacted]w[redacted]d    Secondary diagnosis:  Principal Problem:   Cesarean delivery delivered Active Problems:   Von Willebrand's disease ([place]   Melanoma of skin ([place]   GAD (generalized anxiety disorder)   AMA (advanced maternal age) multigravida 35+   Anemia during pregnancy in third trimester   Gastroesophageal reflux disease without esophagitis  Additional problems: None    Discharge diagnosis: Term Pregnancy Delivered                                              Post partum procedures: protect  none Augmentation:  None Complications: None  Hospital course: Onset of Labor With Vaginal Delivery      35y.o. yo G1P1001 at 410w2das admitted in Latent Labor on 05/19/2021. Patient had an uncomplicated labor course as follows:  Membrane Rupture Time/Date: 10:11 PM ,05/19/2021   Delivery Method:C-Section, Low Transverse  Episiotomy: None  Lacerations:  None  Patient had an uncomplicated postpartum course.  She is ambulating, tolerating a regular diet, passing flatus, and urinating well. Patient is discharged home in stable condition on 05/22/21.  Newborn Data: Birth date:05/19/2021  Birth time:10:12 PM  Gender:Female  Living status:Living  Apgars:9 ,9  Weight:3504 g   Magnesium Sulfate received: No BMZ received: No Rhophylac:N/A MMR:N/A - Immune T-DaP:Given prenatally Flu: N/A - Given prenatally Transfusion:No  Physical exam  Vitals:   05/21/21 0609 05/21/21 1345 05/22/21 0000 05/22/21 0534  BP: 92/62 (!) 94/57 108/67 102/64  Pulse: 76 78 65 63  Resp: '20 20 18 18  ' Temp: 99.7 F (37.6 C) 98.7 F (37.1 C) (!) 97.5 F (36.4 C) 97.7 F (36.5 C)  TempSrc: Oral Oral Oral  Oral  SpO2: 100%  100% 100%  Weight:      Height:       General: Alert, engaged in interview, mood and affect appropriate Lochia: appropriate Uterine Fundus: firm Incision: Healing well, some dried blood on honeycomb dressing, no dehiscence or drainage DVT Evaluation: No evidence of DVT on exam. Labs: Lab Results  Component Value Date   WBC 15.8 (H) 05/20/2021   HGB 8.7 (L) 05/20/2021   HCT 26.5 (L) 05/20/2021   MCV 95.7 05/20/2021   PLT 201 05/20/2021   CMP Latest Ref Rng & Units 04/05/2021  Glucose 70 - 99 mg/dL 114(H)  BUN 6 - 20 mg/dL 5(L)  Creatinine 0.44 - 1.00 mg/dL 0.64  Sodium 135 - 145 mmol/L 137  Potassium 3.5 - 5.1 mmol/L 3.5  Chloride 98 - 111 mmol/L 106  CO2 22 - 32 mmol/L 23  Calcium 8.9 - 10.3 mg/dL 9.2  Total Protein 6.5 - 8.1 g/dL 6.7  Total Bilirubin 0.3 - 1.2 mg/dL 0.3  Alkaline Phos 38 - 126 U/L 132(H)  AST 15 - 41 U/L 14(L)  ALT 0 - 44 U/L 12   Edinburgh Score: Edinburgh Postnatal Depression Scale Screening Tool 05/21/2021  I have been able to laugh and see the funny side of things. 0  I have looked forward with enjoyment to things. 0  I have blamed myself unnecessarily when things went wrong. 2  I have  been anxious or worried for no good reason. 1  I have felt scared or panicky for no good reason. 1  Things have been getting on top of me. 1  I have been so unhappy that I have had difficulty sleeping. 1  I have felt sad or miserable. 0  I have been so unhappy that I have been crying. 0  The thought of harming myself has occurred to me. 0  Edinburgh Postnatal Depression Scale Total 6     After visit meds:  Allergies as of 05/22/2021       Reactions   Codeine Shortness Of Breath   Not anaphylaxis        Medication List     TAKE these medications    acetaminophen 500 MG tablet Commonly known as: TYLENOL Take 2 tablets (1,000 mg total) by mouth every 6 (six) hours.   escitalopram 10 MG tablet Commonly known as: LEXAPRO Take 1  tablet (10 mg total) by mouth daily.   ferrous sulfate 325 (65 FE) MG tablet Commonly known as: FerrouSul Take 1 tablet (325 mg total) by mouth every other day.   ibuprofen 200 MG tablet Commonly known as: ADVIL Take 3 tablets (600 mg total) by mouth every 6 (six) hours.   multivitamin-prenatal 27-0.8 MG Tabs tablet Take 1 tablet by mouth daily at 12 noon.   oxyCODONE 5 MG immediate release tablet Commonly known as: Oxy IR/ROXICODONE Take 1-2 tablets (5-10 mg total) by mouth every 4 (four) hours as needed for moderate pain.   pantoprazole 20 MG tablet Commonly known as: Protonix Take 1 tablet (20 mg total) by mouth daily. You can increase to 2 tabs daily (4m) if 1 tab is not working   senna-docusate 8.6-50 MG tablet Commonly known as: Senokot-S Take 2 tablets by mouth daily.         Discharge home in stable condition Infant Feeding: Breast Infant Disposition:home with mother Discharge instruction: per After Visit Summary and Postpartum booklet. Activity: Advance as tolerated. Pelvic rest for 6 weeks.  Diet: routine diet Future Appointments: Future Appointments  Date Time Provider DL[place] 08/10/2021  7:45 AM [facility] None  08/10/2021  8:40 AM [facility]   Follow up Visit: [facility].  Please schedule this patient for a In person postpartum visit in 4 weeks with the following provider: MD. Additional Postpartum F/U:Incision check 1 week  High risk pregnancy complicated by:  AMA, VWD Delivery mode:  C-Section, Low Transverse  Anticipated Birth Control:  Unsure, plan to have decision at 1 week incision check (may receive depo prior to discharge)    05/22/2021 BPearla Dubonnet MD  GME ATTESTATION:  I saw and evaluated the patient. I agree with the findings and the plan of care as documented in the resident's note.  SDarrelyn Hillock DO OB Fellow, [place]8/30/2022 9:07 AM

## 2021-05-29 ENCOUNTER — Other Ambulatory Visit: Payer: Self-pay

## 2021-05-29 ENCOUNTER — Ambulatory Visit (INDEPENDENT_AMBULATORY_CARE_PROVIDER_SITE_OTHER): Payer: BC Managed Care – PPO

## 2021-05-29 NOTE — Progress Notes (Signed)
Subjective:     Laura [name] is a 35 y.o. female who presents to the clinic 1 weeks status post low uterine, transverse cesarean section. Pt reports incision is healing well.      Objective:    BP 118/76   Pulse 91   LMP 08/10/2020   Breastfeeding Unknown  General:  alert, well appearing, in no apparent distress  Incision:   healing well, no drainage, no erythema, no hernia, no seroma, no swelling, no dehiscence, incision well approximated     Assessment:    Doing well postoperatively.   Plan:    1. Continue any current medications. 2. Wound care discussed. 3. Follow up:  06/12/2021 at postpartum appt.   Huey Bienenstock, CMA

## 2021-05-29 NOTE — Addendum Note (Signed)
Encounter addended by: Kathyrn Drown, RN on: 05/29/2021 4:51 PM  Actions taken: Charge Capture section accepted

## 2021-05-30 NOTE — Progress Notes (Signed)
Patient was assessed and managed by nursing staff during this encounter. I have reviewed the chart and agree with the documentation and plan. I have also made any necessary editorial changes.  Aletha Halim, MD 05/30/2021 8:28 AM

## 2021-05-31 ENCOUNTER — Telehealth (HOSPITAL_COMMUNITY): Payer: Self-pay | Admitting: *Deleted

## 2021-05-31 NOTE — Telephone Encounter (Signed)
Left message to return nurse call.  Odis Hollingshead, RN 05-31-2021 at 11:59am

## 2021-06-12 ENCOUNTER — Encounter: Payer: Self-pay | Admitting: *Deleted

## 2021-06-12 ENCOUNTER — Other Ambulatory Visit: Payer: Self-pay

## 2021-06-12 ENCOUNTER — Ambulatory Visit (INDEPENDENT_AMBULATORY_CARE_PROVIDER_SITE_OTHER): Payer: BC Managed Care – PPO | Admitting: Obstetrics and Gynecology

## 2021-06-12 NOTE — Progress Notes (Signed)
    [place] Partum Visit Note  Laura [name] is a 35 y.o. G1P1001 s/p urgent pLTCS due to non reassurinf FHTs at [place] after presenting in active labor.  Pregnancy c/b vWD, h/o melanoma, AMA.  Anesthesia: epidural. Postpartum course has been uncomplicated. Baby is doing well. Baby is feeding by breast. Bleeding staining only. Bowel function is normal. Bladder function is normal. Patient is not sexually active. Contraception method is abstinence. Postpartum depression screening: negative.  Pap and HPV negative 2019   The pregnancy intention screening data noted above was reviewed. Potential methods of contraception were discussed. The patient elected to proceed with No data recorded.   Edinburgh Postnatal Depression Scale - 06/12/21 1355       Edinburgh Postnatal Depression Scale:  In the Past 7 Days   I have been able to laugh and see the funny side of things. 0    I have looked forward with enjoyment to things. 0    I have blamed myself unnecessarily when things went wrong. 2    I have been anxious or worried for no good reason. 1    I have felt scared or panicky for no good reason. 0    Things have been getting on top of me. 1    I have been so unhappy that I have had difficulty sleeping. 0    I have felt sad or miserable. 0    I have been so unhappy that I have been crying. 0    The thought of harming myself has occurred to me. 0    Edinburgh Postnatal Depression Scale Total 4             Health Maintenance Due  Topic Date Due   COVID-19 Vaccine (3 - Moderna risk series) 01/16/2020   INFLUENZA VACCINE  04/23/2021    Review of Systems Pertinent items noted in HPI and remainder of comprehensive ROS otherwise negative.  Objective:  BP 117/81   Pulse 75   Wt 126 lb (57.2 kg)   BMI 23.05 kg/m    NAD Abdomen: soft, nttp, well healed and c/d/I incision  Assessment:  Patient doing well  Plan:  *PP: routine care d/w her. BC options d/w her and patient to  consider *Heme: has 58m follow up with them already set up  RTC PRN  Aletha Halim, [place] for [employer], [place]

## 2021-08-07 ENCOUNTER — Telehealth: Payer: Self-pay | Admitting: Hematology and Oncology

## 2021-08-07 NOTE — Telephone Encounter (Signed)
Cancelled per 11/15 pt request, said they will call back to r/s

## 2021-08-09 ENCOUNTER — Other Ambulatory Visit: Payer: Self-pay

## 2021-08-09 ENCOUNTER — Other Ambulatory Visit: Payer: Self-pay | Admitting: Family Medicine

## 2021-08-09 NOTE — Progress Notes (Signed)
Return to work forms completed and faxed today.

## 2021-08-10 ENCOUNTER — Inpatient Hospital Stay: Payer: BC Managed Care – PPO

## 2021-08-10 ENCOUNTER — Inpatient Hospital Stay: Payer: BC Managed Care – PPO | Admitting: Hematology and Oncology

## 2021-08-31 ENCOUNTER — Telehealth: Payer: BC Managed Care – PPO | Admitting: Physician Assistant

## 2021-08-31 DIAGNOSIS — H109 Unspecified conjunctivitis: Secondary | ICD-10-CM

## 2021-08-31 MED ORDER — POLYMYXIN B-TRIMETHOPRIM 10000-0.1 UNIT/ML-% OP SOLN: 1.0000 [drp] | OPHTHALMIC | 0 refills | Status: DC

## 2021-08-31 NOTE — Progress Notes (Signed)

## 2022-02-12 ENCOUNTER — Other Ambulatory Visit: Payer: Self-pay | Admitting: Family Medicine

## 2022-02-19 ENCOUNTER — Telehealth: Payer: BC Managed Care – PPO | Admitting: Physician Assistant

## 2022-02-19 DIAGNOSIS — J029 Acute pharyngitis, unspecified: Secondary | ICD-10-CM | POA: Diagnosis not present

## 2022-02-19 NOTE — Progress Notes (Signed)
I have spent 5 minutes in review of e-visit questionnaire, review and updating patient chart, medical decision making and response to patient.   Andera Cranmer Cody Chaise Mahabir, PA-C    

## 2022-02-19 NOTE — Progress Notes (Signed)
  E-Visit for Sore Throat  We are sorry that you are not feeling well.  Here is how we plan to help!  Your symptoms indicate a likely viral infection (Pharyngitis).   Pharyngitis is inflammation in the back of the throat which can cause a sore throat, scratchiness and sometimes difficulty swallowing.   Pharyngitis is typically caused by a respiratory virus and will just run its course.  Please keep in mind that your symptoms could last up to 10 days.  For throat pain, we recommend over the counter oral pain relief medications such as acetaminophen or aspirin, or anti-inflammatory medications such as ibuprofen or naproxen sodium.  Topical treatments such as oral throat lozenges or sprays may be used as needed.  Avoid close contact with loved ones, especially the very young and elderly.  Remember to wash your hands thoroughly throughout the day as this is the number one way to prevent the spread of infection and wipe down door knobs and counters with disinfectant.  After careful review of your answers, I would not recommend an antibiotic for your condition.  Antibiotics should not be used to treat conditions that we suspect are caused by viruses like the virus that causes the common cold or flu. However, some people can have Strep with atypical symptoms. You may need formal testing in clinic or office to confirm if your symptoms continue or worsen.  Providers prescribe antibiotics to treat infections caused by bacteria. Antibiotics are very powerful in treating bacterial infections when they are used properly.  To maintain their effectiveness, they should be used only when necessary.  Overuse of antibiotics has resulted in the development of super bugs that are resistant to treatment!    Home Care: Only take medications as instructed by your medical team. Do not drink alcohol while taking these medications. A steam or ultrasonic humidifier can help congestion.  You can place a towel over your head and  breathe in the steam from hot water coming from a faucet. Avoid close contacts especially the very young and the elderly. Cover your mouth when you cough or sneeze. Always remember to wash your hands.  Get Help Right Away If: You develop worsening fever or throat pain. You develop a severe head ache or visual changes. Your symptoms persist after you have completed your treatment plan.  Make sure you Understand these instructions. Will watch your condition. Will get help right away if you are not doing well or get worse.   Thank you for choosing an e-visit.  Your e-visit answers were reviewed by a board certified advanced clinical practitioner to complete your personal care plan. Depending upon the condition, your plan could have included both over the counter or prescription medications.  Please review your pharmacy choice. Make sure the pharmacy is open so you can pick up prescription now. If there is a problem, you may contact your provider through [employer] and have the prescription routed to another pharmacy.  Your safety is important to [place]. If you have drug allergies check your prescription carefully.   For the next 24 hours you can use MyChart to ask questions about today's visit, request a non-urgent call back, or ask for a work or school excuse. You will get an email in the next two days asking about your experience. I hope that your e-visit has been valuable and will speed your recovery.

## 2022-02-22 ENCOUNTER — Encounter: Payer: Self-pay | Admitting: Family Medicine

## 2022-08-16 ENCOUNTER — Other Ambulatory Visit: Payer: Self-pay | Admitting: Family Medicine

## 2022-08-22 ENCOUNTER — Other Ambulatory Visit: Payer: Self-pay

## 2022-08-22 MED ORDER — ESCITALOPRAM OXALATE 10 MG PO TABS: 10.0000 mg | ORAL_TABLET | Freq: Every day | ORAL | 0 refills | Status: DC

## 2022-10-10 ENCOUNTER — Encounter: Payer: Self-pay | Admitting: Obstetrics and Gynecology

## 2022-10-10 ENCOUNTER — Other Ambulatory Visit (HOSPITAL_COMMUNITY)
Admission: RE | Admit: 2022-10-10 | Discharge: 2022-10-10 | Disposition: A | Discharge status: Home / Self Care | Payer: BC Managed Care – PPO | Source: Ambulatory Visit | Attending: Obstetrics and Gynecology | Admitting: Obstetrics and Gynecology

## 2022-10-10 ENCOUNTER — Ambulatory Visit (INDEPENDENT_AMBULATORY_CARE_PROVIDER_SITE_OTHER): Payer: BC Managed Care – PPO | Admitting: Obstetrics and Gynecology

## 2022-10-10 VITALS — BP 108/74 | HR 76 | Ht 62.0 in | Wt 131.0 lb

## 2022-10-10 DIAGNOSIS — Z85820 Personal history of malignant melanoma of skin: Secondary | ICD-10-CM

## 2022-10-10 DIAGNOSIS — Z01419 Encounter for gynecological examination (general) (routine) without abnormal findings: Secondary | ICD-10-CM | POA: Diagnosis not present

## 2022-10-10 DIAGNOSIS — Z124 Encounter for screening for malignant neoplasm of cervix: Secondary | ICD-10-CM

## 2022-10-10 NOTE — Progress Notes (Signed)
Obstetrics and Gynecology Annual Patient Evaluation  Appointment Date: 10/10/2022  OBGYN Clinic: [facility]  Primary Care Provider: Pcp, No  Referring Provider: No ref. provider found  Chief Complaint:  Chief Complaint  Patient presents with   Gynecologic Exam    History of Present Illness: Laura [name] is a 37 y.o. Caucasian G1P1001 (Patient's last menstrual period was 09/26/2022 (exact date).), seen for the above chief complaint. Her past medical history is significant for vWD, h/o melanoma  No issues or problems today.   Review of Systems: A comprehensive review of systems was negative.   Patient Active Problem List   Diagnosis Date Noted   GAD (generalized anxiety disorder) 04/27/2018   Melanoma of skin ([place]) 03/18/2017   Von Willebrand's disease ([place]) 08/16/2013    Past Medical History:  Past Medical History:  Diagnosis Date   Gastroesophageal reflux disease without esophagitis 04/02/2021   History of chicken pox    Melanoma ([place])    left upper arm   Von Willebrand disease ([place])     Past Surgical History:  Past Surgical History:  Procedure Laterality Date   CESAREAN SECTION Bilateral 05/19/2021   Procedure: CESAREAN SECTION;  Surgeon: Radene Gunning, MD;  Location: [facility] LD ORS;  Service: Obstetrics;  Laterality: Bilateral;    Past Obstetrical History:  OB History  Gravida Para Term Preterm AB Living  '1 1 1 '$ 0 0 1  SAB IAB Ectopic Multiple Live Births  0 0 0 0 1    # Outcome Date GA Lbr Len/2nd Weight Sex Delivery Anes PTL Lv  1 Term 05/19/21 34w2d12:50 / 01:22 7 lb 11.6 oz (3.504 kg) F CS-LTranv EPI, Spinal  LIV     Birth Comments: midline facial hemangiomata, nose upper lip    Past Gynecological History: As per HPI. Periods: qmonth, regular, not particularly heavy, approximatley one week History of Pap Smear(s): Yes.   Last pap 2022, which was negative She is currently using no method for contraception.   Social  History:  Social History   Socioeconomic History   Marital status: Married    Spouse name: Not on file   Number of children: Not on file   Years of education: Not on file   Highest education level: Not on file  Occupational History   Occupation: teacher    Employer: [employer] Tobacco Use   Smoking status: Never   Smokeless tobacco: Never  Vaping Use   Vaping Use: Never used  Substance and Sexual Activity   Alcohol use: Not Currently   Drug use: No   Sexual activity: Yes    Partners: Male    Birth control/protection: None  Other Topics Concern   Not on file  Social History Narrative   Not on file   Social Determinants of Health   Financial Resource Strain: Not on file  Food Insecurity: Not on file  Transportation Needs: Not on file  Physical Activity: Not on file  Stress: Not on file  Social Connections: Not on file  Intimate Partner Violence: Not on file    Family History:  Family History  Problem Relation Age of Onset   Cancer Maternal Grandfather        oral   Heart disease Maternal Grandfather    Alcohol abuse Maternal Grandfather    Breast cancer Paternal Aunt [phone]  Depression Mother    Anxiety disorder Mother    Heart disease Paternal Grandfather    She denies any female cancers  Medications Laura [name] had no medications administered during this visit. Current Outpatient Medications  Medication Sig Dispense Refill   escitalopram (LEXAPRO) 10 MG tablet Take 1 tablet (10 mg total) by mouth daily. 60 tablet 0   No current facility-administered medications for this visit.   Allergies Codeine  Physical Exam:  BP 108/74   Pulse 76   Ht '5\' 2"'$  (1.575 m)   Wt 131 lb (59.4 kg)   LMP 09/26/2022 (Exact Date)   BMI 23.96 kg/m  Body mass index is 23.96 kg/m. General appearance: Well nourished, well developed female in no acute distress.  Neck:  Supple, normal appearance, and no thyromegaly  Cardiovascular: normal s1 and s2.  No murmurs,  rubs or gallops. Respiratory:  Clear to auscultation bilateral. Normal respiratory effort Abdomen: positive bowel sounds and no masses, hernias; diffusely non tender to palpation, non distended Breasts: breasts appear normal, no suspicious masses, no skin or nipple changes or axillary nodes, and normal palpation. Neuro/Psych:  Normal mood and affect.  Skin:  Warm and dry.  Lymphatic:  No inguinal lymphadenopathy.   Pelvic exam: is not limited by body habitus EGBUS: within normal limits Vagina: within normal limits and with no blood or discharge in the vault Cervix: normal appearing cervix without tenderness, discharge or lesions. Uterus:  nonenlarged and non tender Adnexa:  normal adnexa and no mass, fullness, tenderness Rectovaginal: deferred  Laboratory: none  Radiology: none  Assessment: patient doing well  Plan:  1. History of melanoma Followed by Derm. Normal GYN exam today - Cytology - PAP  2. Cervical cancer screening Patient desires - Cytology - PAP  3. Well woman exam Routine care. Birth control and period control options, such as Lysteda d/w her if she desires anything in the future.   RTC 1 year  Aletha Halim, Brooke Bonito MD Attending Center for [employer] farm manager)

## 2022-10-10 NOTE — Progress Notes (Signed)
Patient presents for Annual.  LMP:09/26/22 Monthly with Moderate flow Last pap:10/24/2020 No Hx of Abnormal paps pt wants pap Contraception:None  Mammogram:N/A STD Screening: Declines   CC: None  Fun Fact: Pt will 18 months Next mont.

## 2022-10-11 LAB — CYTOLOGY - PAP
Comment: NEGATIVE
Diagnosis: NEGATIVE
High risk HPV: NEGATIVE

## 2022-10-13 ENCOUNTER — Other Ambulatory Visit: Payer: Self-pay | Admitting: Family Medicine

## 2022-10-14 MED ORDER — ESCITALOPRAM OXALATE 10 MG PO TABS: 10.0000 mg | ORAL_TABLET | Freq: Every day | ORAL | 2 refills | Status: DC

## 2022-12-13 ENCOUNTER — Other Ambulatory Visit: Payer: Self-pay | Admitting: Family Medicine

## 2023-03-30 ENCOUNTER — Telehealth: Payer: BC Managed Care – PPO | Admitting: Family

## 2023-03-30 DIAGNOSIS — W57XXXA Bitten or stung by nonvenomous insect and other nonvenomous arthropods, initial encounter: Secondary | ICD-10-CM | POA: Diagnosis not present

## 2023-03-30 DIAGNOSIS — S70369A Insect bite (nonvenomous), unspecified thigh, initial encounter: Secondary | ICD-10-CM | POA: Diagnosis not present

## 2023-03-30 MED ORDER — TRIAMCINOLONE ACETONIDE 0.5 % EX OINT: 1.0000 | TOPICAL_OINTMENT | Freq: Two times a day (BID) | CUTANEOUS | 0 refills | Status: DC

## 2023-03-30 NOTE — Progress Notes (Signed)
E-Visit for Insect Sting  Thank you for describing the insect sting for [place].  Here is how we plan to help!  An uncomplicated insect sting that just occurred and can be closely followed using the instructions in your care plan.  The 2 greatest risks from insect stings are allergic reaction, which can be fatal in some people and infection, which is more common and less serious.  Bees, wasps, yellow jackets, and hornets belong to a class of insects called Hymenoptera.  Most insect stings cause only minor discomfort.  Stings can happen anywhere on the body and can be painful.  Most stings are from honey bees or yellow jackets.  Fire ants can sting multiple times.  The sites of the stings are more likely to become infected.    Provided a home care guide for insect stings and instructions on when to call for help. I have sent in Kenalog cream, a steroid cream, you can apply twice a day.   What can be used to prevent Insect Stings?  Insect repellant with at least 20% DEET.  Wearing long pants and shirts with socks and shoes.  Wear dark or drab-colored clothes rather than bright colors.  Avoid using perfumes and hair sprays; these attract insects.  HOME CARE ADVICE:  1. Stinger removal: The stinger looks like a tiny black dot in the sting. Use a fingernail, credit card edge, or knife-edge to scrape it off.  Don't pull it out because it squeezes out more venom. If the stinger is below the skin surface, leave it alone.  It will be shed with normal skin healing. 2. Use cold compresses to the area of the sting for 10-20 minutes.  You may repeat this as needed to relieve symptoms of pain and swelling. 3.  For pain relief, take acetominophen 650 mg 4-6 hours as needed or ibuprofen 400 mg every 6-8 hours as needed or naproxen 250-500 mg every 12 hours as needed. 4.  You can also use hydrocortisone cream 0.5% or 1% up to 4 times daily as needed for itching. 5.  If the sting becomes very itchy, take  Benadryl 25-50 mg, follow directions on box. 6.  Wash the area 2-3 times daily with antibacterial soap and warm water. 7. Call your Doctor if: Fever, a severe headache, or rash occur in the next 2 weeks. Sting area begins to look infected. Redness and swelling worsens after home treatment. Your current symptoms become worse.    MAKE SURE YOU:  Understand these instructions. Will watch your condition. Will get help right away if you are not doing well or get worse.  Thank you for choosing an e-visit.  Your e-visit answers were reviewed by a board certified advanced clinical practitioner to complete your personal care plan. Depending upon the condition, your plan could have included both over the counter or prescription medications.  Please review your pharmacy choice. Make sure the pharmacy is open so you can pick up prescription now. If there is a problem, you may contact your provider through [employer] and have the prescription routed to another pharmacy.  Your safety is important to [place]. If you have drug allergies check your prescription carefully.   For the next 24 hours you can use MyChart to ask questions about today's visit, request a non-urgent call back, or ask for a work or school excuse. You will get an email in the next two days asking about your experience. I hope that your e-visit has been valuable and  will speed your recovery.  Approximately 5 minutes was spent documenting and reviewing patient's chart.

## 2023-04-12 ENCOUNTER — Other Ambulatory Visit: Payer: Self-pay | Admitting: Family Medicine

## 2023-06-17 ENCOUNTER — Ambulatory Visit: Payer: BC Managed Care – PPO | Admitting: Family

## 2023-06-17 ENCOUNTER — Encounter: Payer: Self-pay | Admitting: Family

## 2023-06-17 VITALS — BP 112/70 | HR 83 | Temp 97.5°F | Ht 62.0 in | Wt 127.0 lb

## 2023-06-17 DIAGNOSIS — Z13220 Encounter for screening for lipoid disorders: Secondary | ICD-10-CM | POA: Diagnosis not present

## 2023-06-17 DIAGNOSIS — D6800 Von Willebrand disease, unspecified: Secondary | ICD-10-CM

## 2023-06-17 DIAGNOSIS — D500 Iron deficiency anemia secondary to blood loss (chronic): Secondary | ICD-10-CM | POA: Insufficient documentation

## 2023-06-17 DIAGNOSIS — Z23 Encounter for immunization: Secondary | ICD-10-CM

## 2023-06-17 DIAGNOSIS — F411 Generalized anxiety disorder: Secondary | ICD-10-CM

## 2023-06-17 DIAGNOSIS — R0602 Shortness of breath: Secondary | ICD-10-CM | POA: Insufficient documentation

## 2023-06-17 DIAGNOSIS — Z0000 Encounter for general adult medical examination without abnormal findings: Secondary | ICD-10-CM | POA: Diagnosis not present

## 2023-06-17 DIAGNOSIS — R739 Hyperglycemia, unspecified: Secondary | ICD-10-CM | POA: Insufficient documentation

## 2023-06-17 DIAGNOSIS — Z85820 Personal history of malignant melanoma of skin: Secondary | ICD-10-CM | POA: Insufficient documentation

## 2023-06-17 LAB — BASIC METABOLIC PANEL
BUN: 10 mg/dL (ref 6–23)
CO2: 28 mEq/L (ref 19–32)
Calcium: 9.7 mg/dL (ref 8.4–10.5)
Chloride: 103 mEq/L (ref 96–112)
Creatinine, Ser: 0.71 mg/dL (ref 0.40–1.20)
GFR: 107.72 mL/min (ref >60.00)
Glucose, Bld: 86 mg/dL (ref 70–99)
Potassium: 4.1 mEq/L (ref 3.5–5.1)
Sodium: 140 mEq/L (ref 135–145)

## 2023-06-17 LAB — CBC WITH DIFFERENTIAL/PLATELET
Basophils Absolute: 0 10*3/uL (ref 0.0–0.1)
Basophils Relative: 0.3 % (ref 0.0–3.0)
Eosinophils Absolute: 0.1 10*3/uL (ref 0.0–0.7)
Eosinophils Relative: 1.4 % (ref 0.0–5.0)
HCT: 38.8 % (ref 36.0–46.0)
Hemoglobin: 12.7 g/dL (ref 12.0–15.0)
Lymphocytes Relative: 33.8 % (ref 12.0–46.0)
Lymphs Abs: 2 10*3/uL (ref 0.7–4.0)
MCHC: 32.6 g/dL (ref 30.0–36.0)
MCV: 92.1 fl (ref 78.0–100.0)
Monocytes Absolute: 0.4 10*3/uL (ref 0.1–1.0)
Monocytes Relative: 6.4 % (ref 3.0–12.0)
Neutro Abs: 3.4 10*3/uL (ref 1.4–7.7)
Neutrophils Relative %: 58.1 % (ref 43.0–77.0)
Platelets: 356 10*3/uL (ref 150.0–400.0)
RBC: 4.22 Mil/uL (ref 3.87–5.11)
RDW: 13.4 % (ref 11.5–15.5)
WBC: 5.9 10*3/uL (ref 4.0–10.5)

## 2023-06-17 LAB — LIPID PANEL
Cholesterol: 181 mg/dL (ref 0–200)
HDL: 60.5 mg/dL (ref >39.00)
LDL Cholesterol: 101 mg/dL — ABNORMAL HIGH (ref 0–99)
NonHDL: 120.55
Total CHOL/HDL Ratio: 3
Triglycerides: 97 mg/dL (ref 0.0–149.0)
VLDL: 19.4 mg/dL (ref 0.0–40.0)

## 2023-06-17 LAB — IBC + FERRITIN
Ferritin: 17.5 ng/mL (ref 10.0–291.0)
Iron: 64 ug/dL (ref 42–145)
Saturation Ratios: 17.4 % — ABNORMAL LOW (ref 20.0–50.0)
TIBC: 366.8 ug/dL (ref 250.0–450.0)
Transferrin: 262 mg/dL (ref 212.0–360.0)

## 2023-06-17 LAB — HEMOGLOBIN A1C: Hgb A1c MFr Bld: 5.3 % (ref 4.6–6.5)

## 2023-06-17 LAB — TSH: TSH: 1.4 u[IU]/mL (ref 0.35–5.50)

## 2023-06-17 MED ORDER — MONTELUKAST SODIUM 10 MG PO TABS
10.0000 mg | ORAL_TABLET | Freq: Every day | ORAL | 3 refills | Status: DC
Start: 2023-06-17 — End: 2024-07-06

## 2023-06-17 NOTE — Assessment & Plan Note (Signed)
Lung sounds unremarkable on PE  Trial singulair 10 mg once daily Pt declines need for albuterol  Suspect residual for covid

## 2023-06-17 NOTE — Assessment & Plan Note (Signed)
Patient Counseling(The following topics were reviewed): ? Preventative care handout given to pt  ?Health maintenance and immunizations reviewed. Please refer to Health maintenance section. ?Pt advised on safe sex, wearing seatbelts in car, and proper nutrition ?labwork ordered today for annual ?Dental health: Discussed importance of regular tooth brushing, flossing, and dental visits. ? ? ?

## 2023-06-17 NOTE — Progress Notes (Signed)
New Patient Office Visit  Subjective:  Patient ID: Laura [name], female    DOB: [birth year]  Age: 37 y.o. MRN: [id number]  CC:  Chief Complaint  Patient presents with   Establish Care    HPI Laura [name] is here to establish care as a new patient.  Oriented to practice routines and expectations.  Prior provider was: Nicki Reaper, FNP  Has not established with anyone in between other than OB due to pregnancy. Dr. Shawnie Pons, GYN.   Pt is with acute concerns.  Had covid about one month ago, and ever since she feels slightly more sob than normal. At times finds she has to take deeper breaths than normal but not overly notable. Does find at times with exercise it is slightly worse.   Pap 10/10/22  Flu shot, completed.  Utd on vision and dental screenings.  No known h/o STD  Exercise: not an established routine.  Diet: general    chronic concerns:  H/o melanoma, sees dermatology once a year.     ROS: Negative unless specifically indicated above in HPI.   Current Outpatient Medications:    escitalopram (LEXAPRO) 10 MG tablet, TAKE 1 TABLET BY MOUTH EVERY DAY, Disp: 60 tablet, Rfl: 2   montelukast (SINGULAIR) 10 MG tablet, Take 1 tablet (10 mg total) by mouth at bedtime., Disp: 90 tablet, Rfl: 3 Past Medical History:  Diagnosis Date   Gastroesophageal reflux disease without esophagitis 04/02/2021   History of chicken pox    Melanoma (HCC)    left upper arm   Von Willebrand disease (HCC)    Past Surgical History:  Procedure Laterality Date   CESAREAN SECTION Bilateral 05/19/2021   Procedure: CESAREAN SECTION;  Surgeon: Milas Hock, MD;  Location: [facility];  Service: Obstetrics;  Laterality: Bilateral;    Objective:   Today's Vitals: BP 112/70 (BP Location: Left Arm, Patient Position: Sitting, Cuff Size: Normal)   Pulse 83   Temp (!) 97.5 F (36.4 C) (Temporal)   Ht 5\' 2"  (1.575 m)   Wt 127 lb (57.6 kg)   LMP 06/10/2023 (Exact Date)   SpO2 97%   BMI 23.23  kg/m   Physical Exam Constitutional:      General: She is not in acute distress.    Appearance: Normal appearance. She is normal weight. She is not ill-appearing.  HENT:     Head: Normocephalic.     Right Ear: Tympanic membrane normal.     Left Ear: Tympanic membrane normal.     Nose: Nose normal.     Mouth/Throat:     Mouth: Mucous membranes are moist.  Eyes:     Extraocular Movements: Extraocular movements intact.     Pupils: Pupils are equal, round, and reactive to light.  Cardiovascular:     Rate and Rhythm: Normal rate and regular rhythm.  Pulmonary:     Effort: Pulmonary effort is normal.     Breath sounds: Normal breath sounds.  Abdominal:     General: Abdomen is flat. Bowel sounds are normal.     Palpations: Abdomen is soft.     Tenderness: There is no guarding or rebound.  Musculoskeletal:        General: Normal range of motion.     Cervical back: Normal range of motion.  Skin:    General: Skin is warm.     Capillary Refill: Capillary refill takes less than 2 seconds.  Neurological:     General: No focal deficit present.  Mental Status: She is alert.  Psychiatric:        Mood and Affect: Mood normal.        Behavior: Behavior normal.        Thought Content: Thought content normal.        Judgment: Judgment normal.     Assessment & Plan:  History of melanoma Assessment & Plan: Cont regular f/u with derm as scheduled.     Encounter for immunization -     Flu vaccine trivalent PF, 6mos and older(Flulaval,Afluria,Fluarix,Fluzone)  Von Willebrand's disease (HCC)  Iron deficiency anemia due to chronic blood loss Assessment & Plan: Cbc ibc ferritin ordered pending results.    Hyperglycemia  Encounter for general adult medical examination without abnormal findings Assessment & Plan: Patient Counseling(The following topics were reviewed):  Preventative care handout given to pt  Health maintenance and immunizations reviewed. Please refer to Health  maintenance section. Pt advised on safe sex, wearing seatbelts in car, and proper nutrition labwork ordered today for annual Dental health: Discussed importance of regular tooth brushing, flossing, and dental visits.   Orders: -     Basic metabolic panel -     Hemoglobin A1c -     CBC with Differential/Platelet -     IBC + Ferritin -     TSH  Screening for lipoid disorders -     Lipid panel  SOB (shortness of breath) Assessment & Plan: Lung sounds unremarkable on PE  Trial singulair 10 mg once daily Pt declines need for albuterol  Suspect residual for covid  Orders: -     Montelukast Sodium; Take 1 tablet (10 mg total) by mouth at bedtime.  Dispense: 90 tablet; Refill: 3  GAD (generalized anxiety disorder) Assessment & Plan: Stable Cont lexapro 10 mg once daily     Follow-up: Return in about 1 year (around 06/16/2024) for f/u CPE.   Mort Sawyers, FNP

## 2023-06-17 NOTE — Patient Instructions (Signed)
Patient Counseling(The following topics were reviewed): ? Preventative care handout given to pt  ?Health maintenance and immunizations reviewed. Please refer to Health maintenance section. ?Pt advised on safe sex, wearing seatbelts in car, and proper nutrition ?labwork ordered today for annual ?Dental health: Discussed importance of regular tooth brushing, flossing, and dental visits. ? ? ?

## 2023-06-17 NOTE — Assessment & Plan Note (Signed)
Cont regular f/u with derm as scheduled.

## 2023-06-17 NOTE — Assessment & Plan Note (Signed)
Stable Cont lexapro 10 mg once daily

## 2023-06-17 NOTE — Assessment & Plan Note (Signed)
Cbc ibc ferritin ordered pending results

## 2023-08-06 ENCOUNTER — Other Ambulatory Visit: Payer: Self-pay | Admitting: Family Medicine

## 2023-10-09 ENCOUNTER — Other Ambulatory Visit: Payer: Self-pay | Admitting: *Deleted

## 2023-10-09 ENCOUNTER — Encounter: Payer: Self-pay | Admitting: Family Medicine

## 2023-10-09 MED ORDER — ESCITALOPRAM OXALATE 10 MG PO TABS: 10.0000 mg | ORAL_TABLET | Freq: Every day | ORAL | 0 refills | Status: DC

## 2023-10-20 ENCOUNTER — Encounter: Payer: Self-pay | Admitting: Obstetrics and Gynecology

## 2023-10-20 ENCOUNTER — Ambulatory Visit (INDEPENDENT_AMBULATORY_CARE_PROVIDER_SITE_OTHER): Payer: 59 | Admitting: Obstetrics and Gynecology

## 2023-10-20 VITALS — BP 118/78 | HR 75 | Ht 62.0 in | Wt 131.0 lb

## 2023-10-20 DIAGNOSIS — Z01419 Encounter for gynecological examination (general) (routine) without abnormal findings: Secondary | ICD-10-CM

## 2023-10-20 DIAGNOSIS — Z85820 Personal history of malignant melanoma of skin: Secondary | ICD-10-CM | POA: Diagnosis not present

## 2023-10-20 NOTE — Progress Notes (Unsigned)
Patient presents for Annual.  Last pap: Date: 10/10/2022 WNL   pt wants pap today  Contraception: None Mammogram: Not yet indicated Family Hx of Breast Cancer PAunt STD Screening: Declines Flu Vaccine :  Already received  CC: Annual/None

## 2023-10-21 NOTE — Progress Notes (Signed)
Obstetrics and Gynecology New Patient Evaluation  Appointment Date: 10/20/2023  OBGYN Clinic: [facility]  Primary Care Provider: Mort Sawyers  Chief Complaint:  Chief Complaint  Patient presents with   Gynecologic Exam    History of Present Illness: Laura [name] is a 38 y.o. Caucasian G1P1001 (Patient's last menstrual period was 10/09/2023 (approximate).), seen for the above chief complaint. Her past medical history is significant for h/o melanoma, vWD.  Patient doing well and no complaints or issues.   Review of Systems: Pertinent items are noted in HPI.   As Per HPI otherwise negative  Patient Active Problem List   Diagnosis Date Noted   History of melanoma 06/17/2023   Iron deficiency anemia due to chronic blood loss 06/17/2023   SOB (shortness of breath) 06/17/2023   GAD (generalized anxiety disorder) 04/27/2018   Von Willebrand's disease (HCC) 08/16/2013    Past Medical History:  Past Medical History:  Diagnosis Date   Gastroesophageal reflux disease without esophagitis 04/02/2021   History of chicken pox    Melanoma (HCC)    left upper arm   Von Willebrand disease (HCC)     Past Surgical History:  Past Surgical History:  Procedure Laterality Date   CESAREAN SECTION Bilateral 05/19/2021   Procedure: CESAREAN SECTION;  Surgeon: Milas Hock, MD;  Location: [facility];  Service: Obstetrics;  Laterality: Bilateral;    Past Obstetrical History:  OB History  Gravida Para Term Preterm AB Living  1 1 1  0 0 1  SAB IAB Ectopic Multiple Live Births  0 0 0 0 1    # Outcome Date GA Lbr Len/2nd Weight Sex Type Anes PTL Lv  1 Term 05/19/21 [redacted]w[redacted]d 12:50 / 01:22 7 lb 11.6 oz (3.504 kg) F CS-LTranv EPI, Spinal  LIV     Birth Comments: midline facial hemangiomata, nose upper lip   Past Gynecological History: As per HPI. Periods: qmonth, regular, not particularly heavy, approximatley one week History of Pap Smear(s): Yes.   Last pap  09/2022, which was negative and HPV negative She is currently using no method for contraception.   Social History:  Social History   Socioeconomic History   Marital status: Married    Spouse name: Not on file   Number of children: 1   Years of education: Not on file   Highest education level: Not on file  Occupational History   Occupation: Magazine features editor: [employer]    Comment: first grade  Tobacco Use   Smoking status: Never   Smokeless tobacco: Never  Vaping Use   Vaping status: Never Used  Substance and Sexual Activity   Alcohol use: Not Currently   Drug use: No   Sexual activity: Yes    Partners: Male    Birth control/protection: None    Comment:   Other Topics Concern   Not on file  Social History Narrative   Daugther, age 14, 2024, elly    Social Drivers of Corporate investment banker Strain: Not on file  Food Insecurity: Not on file  Transportation Needs: Not on file  Physical Activity: Not on file  Stress: Not on file  Social Connections: Not on file  Intimate Partner Violence: Not on file    Family History:  Family History  Problem Relation Age of Onset   Depression Mother    Anxiety disorder Mother    Cancer Maternal Grandfather        oral   Heart disease  Maternal Grandfather    Alcohol abuse Maternal Grandfather    Heart disease Paternal Grandfather    Breast cancer Paternal Aunt 55    Medications Josiah Lobo had no medications administered during this visit. Current Outpatient Medications  Medication Sig Dispense Refill   escitalopram (LEXAPRO) 10 MG tablet Take 1 tablet (10 mg total) by mouth daily. 90 tablet 0   montelukast (SINGULAIR) 10 MG tablet Take 1 tablet (10 mg total) by mouth at bedtime. 90 tablet 3   No current facility-administered medications for this visit.    Allergies Codeine   Physical Exam:  BP 118/78   Pulse 75   Ht 5\' 2"  (1.575 m)   Wt 131 lb (59.4 kg)   LMP 10/09/2023 (Approximate)   BMI  23.96 kg/m  Body mass index is 23.96 kg/m. General appearance: Well nourished, well developed female in no acute distress.  Neck:  Supple, normal appearance, and no thyromegaly  Cardiovascular: normal s1 and s2.  No murmurs, rubs or gallops. Respiratory:  Clear to auscultation bilateral. Normal respiratory effort Abdomen: positive bowel sounds and no masses, hernias; diffusely non tender to palpation, non distended Breasts: breasts appear normal, no suspicious masses, no skin or nipple changes or axillary nodes, and normal palpation. Small (1-34mm) brown nevus, flat, nttp on underside of left arm (triceps are) which pt states is stable. Neuro/Psych:  Normal mood and affect.  Skin:  Warm and dry.  Lymphatic:  No inguinal lymphadenopathy.   Cervical exam performed in the presence of a chaperone Pelvic exam: is not limited by body habitus EGBUS: within normal limits Vagina: within normal limits and with no blood or discharge in the vault Cervix: normal appearing cervix without tenderness, discharge or lesions Uterus:  nonenlarged and non tender Adnexa:  normal adnexa Rectovaginal: deferred Two 1-30mm slightly brown, flat, nevi near right gluteal fold  Laboratory: none  Radiology: none  Assessment: patient stable  Plan:  1. History of melanoma (Primary) Normal female exam. Patient followed by Derm  2. Well woman exam with routine gynecological exam Routine care  Return in about 1 year (around 10/19/2024).   Cornelia Copa MD Attending [facility] [employer] [facility])

## 2024-01-04 ENCOUNTER — Other Ambulatory Visit: Payer: Self-pay | Admitting: Family Medicine

## 2024-01-05 ENCOUNTER — Other Ambulatory Visit: Payer: Self-pay | Admitting: Family Medicine

## 2024-01-06 MED ORDER — ESCITALOPRAM OXALATE 10 MG PO TABS: 10.0000 mg | ORAL_TABLET | Freq: Every day | ORAL | 0 refills | Status: DC

## 2024-04-01 ENCOUNTER — Encounter: Payer: Self-pay | Admitting: Obstetrics and Gynecology

## 2024-04-02 ENCOUNTER — Other Ambulatory Visit: Payer: Self-pay | Admitting: Family Medicine

## 2024-06-15 ENCOUNTER — Inpatient Hospital Stay (HOSPITAL_COMMUNITY)
Admission: AD | Admit: 2024-06-15 | Discharge: 2024-06-15 | Disposition: A | Discharge status: Home / Self Care | Attending: Obstetrics and Gynecology | Admitting: Obstetrics and Gynecology

## 2024-06-15 ENCOUNTER — Inpatient Hospital Stay (HOSPITAL_COMMUNITY)

## 2024-06-15 ENCOUNTER — Encounter (HOSPITAL_COMMUNITY): Payer: Self-pay

## 2024-06-15 DIAGNOSIS — O26851 Spotting complicating pregnancy, first trimester: Secondary | ICD-10-CM

## 2024-06-15 DIAGNOSIS — O209 Hemorrhage in early pregnancy, unspecified: Secondary | ICD-10-CM | POA: Diagnosis present

## 2024-06-15 DIAGNOSIS — Z3A01 Less than 8 weeks gestation of pregnancy: Secondary | ICD-10-CM

## 2024-06-15 DIAGNOSIS — Z113 Encounter for screening for infections with a predominantly sexual mode of transmission: Secondary | ICD-10-CM | POA: Diagnosis present

## 2024-06-15 DIAGNOSIS — Z3490 Encounter for supervision of normal pregnancy, unspecified, unspecified trimester: Secondary | ICD-10-CM

## 2024-06-15 LAB — CBC
HCT: 36.9 % (ref 36.0–46.0)
Hemoglobin: 12.1 g/dL (ref 12.0–15.0)
MCH: 29.7 pg (ref 26.0–34.0)
MCHC: 32.8 g/dL (ref 30.0–36.0)
MCV: 90.7 fL (ref 80.0–100.0)
Platelets: 375 K/uL (ref 150–400)
RBC: 4.07 MIL/uL (ref 3.87–5.11)
RDW: 13.1 % (ref 11.5–15.5)
WBC: 12.5 K/uL — ABNORMAL HIGH (ref 4.0–10.5)
nRBC: 0 % (ref 0.0–0.2)

## 2024-06-15 LAB — POCT PREGNANCY, URINE: Preg Test, Ur: POSITIVE — AB

## 2024-06-15 LAB — HCG, QUANTITATIVE, PREGNANCY: hCG, Beta Chain, Quant, S: 6742 m[IU]/mL — ABNORMAL HIGH (ref <5)

## 2024-06-15 LAB — WET PREP, GENITAL
Clue Cells Wet Prep HPF POC: NONE SEEN
Sperm: NONE SEEN
Trich, Wet Prep: NONE SEEN
WBC, Wet Prep HPF POC: >=10 — AB (ref <10)
Yeast Wet Prep HPF POC: NONE SEEN

## 2024-06-15 LAB — HIV ANTIBODY (ROUTINE TESTING W REFLEX): HIV Screen 4th Generation wRfx: NONREACTIVE

## 2024-06-15 NOTE — Discharge Instructions (Signed)
 Return to MAU: If you have heavier bleeding that soaks through more that 2 pads per hour for an hour or more If you bleed so much that you feel like you might pass out or you do pass out If you have significant abdominal pain that is not improved with Tylenol 1000 mg every 8 hours as needed for pain If you develop a fever > 100.5

## 2024-06-15 NOTE — MAU Provider Note (Signed)
 History     CSN: [id number]  Arrival date and time: 06/15/24 1542   Event Date/Time   First Provider Initiated Contact with Patient 06/15/24 1945      Chief Complaint  Patient presents with   Vaginal Bleeding   HPI [name] is a 38 y.o. year old G72P1001 female at [redacted]w[redacted]d weeks gestation who presents to MAU reporting persistent vaginal spotting since Sunday. She describes the spotting as only with wiping. She denies any abnormal vaginal discharge or pain. She denies any recent SI. She plans to receive [facility] with [facility]; next appt is 06/22/2024 & 07/06/2024.   OB History     Gravida  2   Para  1   Term  1   Preterm  0   AB  0   Living  1      SAB  0   IAB  0   Ectopic  0   Multiple  0   Live Births  1           Past Medical History:  Diagnosis Date   Gastroesophageal reflux disease without esophagitis 04/02/2021   History of chicken pox    Melanoma (HCC)    left upper arm   Von Willebrand disease (HCC)     Past Surgical History:  Procedure Laterality Date   CESAREAN SECTION Bilateral 05/19/2021   Procedure: CESAREAN SECTION;  Surgeon: Cleatus Moccasin, MD;  Location: [facility];  Service: Obstetrics;  Laterality: Bilateral;    Family History  Problem Relation Age of Onset   Depression Mother    Anxiety disorder Mother    Cancer Maternal Grandfather        oral   Heart disease Maternal Grandfather    Alcohol abuse Maternal Grandfather    Heart disease Paternal Grandfather    Breast cancer Paternal Aunt 71    Social History   Tobacco Use   Smoking status: Never   Smokeless tobacco: Never  Vaping Use   Vaping status: Never Used  Substance Use Topics   Alcohol use: Not Currently   Drug use: No    Allergies:  Allergies  Allergen Reactions   Codeine Shortness Of Breath    Not anaphylaxis    Medications Prior to Admission  Medication Sig Dispense Refill Last Dose/Taking   escitalopram  (LEXAPRO ) 10 MG tablet TAKE 1  TABLET BY MOUTH EVERY DAY 90 tablet 0    montelukast  (SINGULAIR ) 10 MG tablet Take 1 tablet (10 mg total) by mouth at bedtime. 90 tablet 3     Review of Systems  Constitutional: Negative.   HENT: Negative.    Eyes: Negative.   Respiratory: Negative.    Cardiovascular: Negative.   Gastrointestinal: Negative.   Endocrine: Negative.   Genitourinary:  Positive for vaginal bleeding (spotting; scant drops on a pantiliner).  Musculoskeletal: Negative.   Skin: Negative.   Allergic/Immunologic: Negative.   Neurological: Negative.   Hematological: Negative.   Psychiatric/Behavioral: Negative.     Physical Exam   Blood pressure 118/63, pulse 95, temperature 98.3 F (36.8 C), temperature source Oral, resp. rate 14, height 5' 2 (1.575 m), weight 57.6 kg, last menstrual period 04/24/2024, SpO2 100%, unknown if currently breastfeeding.  Physical Exam Vitals and nursing note reviewed.  Constitutional:      Appearance: Normal appearance. She is normal weight.  Cardiovascular:     Rate and Rhythm: Normal rate.  Pulmonary:     Effort: Pulmonary effort is normal.  Abdominal:  Palpations: Abdomen is soft.  Genitourinary:    Comments: Swabs collected by patient using blind swab technique  Musculoskeletal:        General: Normal range of motion.  Skin:    General: Skin is warm and dry.  Neurological:     Mental Status: She is alert and oriented to person, place, and time.  Psychiatric:        Mood and Affect: Mood normal.        Behavior: Behavior normal.        Thought Content: Thought content normal.        Judgment: Judgment normal.    MAU Course  Procedures  MDM CCUA UPT CBC ABO/Rh HCG Wet Prep GC/CT -- Results pending  RPR -- Results pending  OB U/S < 14 wks TVUS  Results for orders placed or performed during the hospital encounter of 06/15/24 (from the past 24 hours)  Pregnancy, urine POC     Status: Abnormal   Collection Time: 06/15/24  4:18 PM  Result Value  Ref Range   Preg Test, Ur POSITIVE (A) NEGATIVE  Wet prep, genital     Status: Abnormal   Collection Time: 06/15/24  4:24 PM   Specimen: PATH Cytology Cervicovaginal Ancillary Only  Result Value Ref Range   Yeast Wet Prep HPF POC NONE SEEN NONE SEEN   Trich, Wet Prep NONE SEEN NONE SEEN   Clue Cells Wet Prep HPF POC NONE SEEN NONE SEEN   WBC, Wet Prep HPF POC >=10 (A) <10   Sperm NONE SEEN   CBC     Status: Abnormal   Collection Time: 06/15/24  4:50 PM  Result Value Ref Range   WBC 12.5 (H) 4.0 - 10.5 K/uL   RBC 4.07 3.87 - 5.11 MIL/uL   Hemoglobin 12.1 12.0 - 15.0 g/dL   HCT 63.0 63.9 - 53.9 %   MCV 90.7 80.0 - 100.0 fL   MCH 29.7 26.0 - 34.0 pg   MCHC 32.8 30.0 - 36.0 g/dL   RDW 86.8 88.4 - 84.4 %   Platelets 375 150 - 400 K/uL   nRBC 0.0 0.0 - 0.2 %  hCG, quantitative, pregnancy     Status: Abnormal   Collection Time: 06/15/24  4:50 PM  Result Value Ref Range   hCG, Beta Chain, Quant, S 6,742 (H) <5 mIU/mL    US  OB LESS THAN 14 WEEKS WITH OB TRANSVAGINAL Result Date: 06/15/2024 CLINICAL DATA:  Vaginal bleeding during early pregnancy. Vaginal bleeding for 1 week. Estimated gestational age by LMP is 4 weeks 3 days. Quantitative beta HCG is pending. EXAM: OBSTETRIC <14 WK US  AND TRANSVAGINAL OB US  TECHNIQUE: Both transabdominal and transvaginal ultrasound examinations were performed for complete evaluation of the gestation as well as the maternal uterus, adnexal regions, and pelvic cul-de-sac. Transvaginal technique was performed to assess early pregnancy. COMPARISON:  None Available. FINDINGS: Intrauterine gestational sac: A single intrauterine gestational sac is present. Yolk sac:  Yolk sac is visualized. Embryo:  Fetal pole is identified. Cardiac Activity: Fetal cardiac activity is observed. Heart Rate: 90 bpm CRL:  3.5 mm   6 w   0 d                  US  EDC: 02/08/2025 Subchorionic hemorrhage:  None visualized. Maternal uterus/adnexae: Uterus is retroverted. No myometrial mass  lesions are identified. Both ovaries are visualized and appear normal. No abnormal adnexal masses or collections. No free fluid. IMPRESSION: 1. Single intrauterine pregnancy. Estimated  gestational age by crown-rump length is 6 weeks 0 days. No acute complication is suggested sonographically. Electronically Signed   By: Elsie Gravely M.D.   On: 06/15/2024 18:03    Assessment and Plan  1. Spotting affecting pregnancy in first trimester (Primary) - Information provided on vaginal bleeding in pregnancy - Return to MAU: If you have heavier bleeding that soaks through more that 2 pads per hour for an hour or more If you bleed so much that you feel like you might pass out or you do pass out If you have significant abdominal pain that is not improved with Tylenol  1000 mg every 8 hours as needed for pain If you develop a fever > 100.5    2. Intrauterine pregnancy - Recommend repeat U/S in 2 weeks for viability d/t low FHR on today's U/S  3. [redacted] weeks gestation of pregnancy   - Discharge home - Message to Clinical Pool to reschedule 06/22/2024 U/S to 06/29/2024 - Keep scheduled appt with [facility] on 07/06/2024 - Patient verbalized an understanding of the plan of care and agrees.   Ala Cart, CNM 06/15/2024, 7:45 PM

## 2024-06-15 NOTE — MAU Note (Signed)
..  Laura [name] is a 38 y.o. at [redacted]w[redacted]d here in MAU reporting: spotting that started on Sunday. Patient reports that it has been persistent since Sunday and is mostly when she wipes. She denies abdominal pain or any other abnormal vaginal discharge. Denies recent intercourse.   Onset of complaint: 06/13/24 Pain score: 0 Vitals:   06/15/24 1634  BP: 118/63  Pulse: 95  Resp: 14  Temp: 98.3 F (36.8 C)  SpO2: 100%      Lab orders placed from triage:   UPT

## 2024-06-16 ENCOUNTER — Ambulatory Visit (INDEPENDENT_AMBULATORY_CARE_PROVIDER_SITE_OTHER): Admitting: Obstetrics and Gynecology

## 2024-06-16 ENCOUNTER — Other Ambulatory Visit (INDEPENDENT_AMBULATORY_CARE_PROVIDER_SITE_OTHER): Payer: Self-pay

## 2024-06-16 ENCOUNTER — Ambulatory Visit: Payer: Self-pay | Admitting: Family Medicine

## 2024-06-16 VITALS — BP 116/79 | HR 93

## 2024-06-16 DIAGNOSIS — O021 Missed abortion: Secondary | ICD-10-CM

## 2024-06-16 DIAGNOSIS — Z3A01 Less than 8 weeks gestation of pregnancy: Secondary | ICD-10-CM

## 2024-06-16 DIAGNOSIS — O09521 Supervision of elderly multigravida, first trimester: Secondary | ICD-10-CM

## 2024-06-16 DIAGNOSIS — O26851 Spotting complicating pregnancy, first trimester: Secondary | ICD-10-CM

## 2024-06-16 DIAGNOSIS — D6800 Von Willebrand disease, unspecified: Secondary | ICD-10-CM

## 2024-06-16 DIAGNOSIS — O209 Hemorrhage in early pregnancy, unspecified: Secondary | ICD-10-CM

## 2024-06-16 DIAGNOSIS — O034 Incomplete spontaneous abortion without complication: Secondary | ICD-10-CM

## 2024-06-16 LAB — GC/CHLAMYDIA PROBE AMP (~~LOC~~) NOT AT ARMC
Chlamydia: NEGATIVE
Comment: NEGATIVE
Comment: NORMAL
Neisseria Gonorrhea: NEGATIVE

## 2024-06-16 LAB — RPR: RPR Ser Ql: NONREACTIVE

## 2024-06-16 MED ORDER — OXYCODONE-ACETAMINOPHEN 5-325 MG PO TABS: 1.0000 | ORAL_TABLET | ORAL | 0 refills | Status: DC | PRN

## 2024-06-16 MED ORDER — MISOPROSTOL 200 MCG PO TABS
ORAL_TABLET | ORAL | 1 refills | Status: DC
Start: 2024-06-16 — End: 2024-07-06

## 2024-06-16 MED ORDER — PROMETHAZINE HCL 25 MG PO TABS: 25.0000 mg | ORAL_TABLET | Freq: Four times a day (QID) | ORAL | 0 refills | Status: DC | PRN

## 2024-06-16 MED ORDER — IBUPROFEN 600 MG PO TABS: 600.0000 mg | ORAL_TABLET | Freq: Four times a day (QID) | ORAL | 0 refills | Status: DC | PRN

## 2024-06-16 NOTE — Progress Notes (Signed)

## 2024-06-21 ENCOUNTER — Encounter: Payer: Self-pay | Admitting: *Deleted

## 2024-06-21 DIAGNOSIS — O034 Incomplete spontaneous abortion without complication: Secondary | ICD-10-CM | POA: Insufficient documentation

## 2024-06-21 NOTE — Progress Notes (Signed)
 Obstetrics and Gynecology Visit Return Patient Evaluation  Appointment Date: 06/16/2024  Primary Care Provider: Corwin Antu  OBGYN Clinic: [facility]  Chief Complaint: add on for concern for SAB  History of Present Illness:  Laura [name] is a 38 y.o. G2P1001 with above chief complaint. PMHx significant for AMA and h/o vWD (no special precautions, measures needed with last delivery)  Patient had 9/23 u/s at the [facility], after presenting due to VB, that showed a CRL c/w 6/0 weeks with a FHR of 90. Pt called the office today with increased VB so appointment made  U/s today doesn't show obvious GS with fetal pole. Minimal bleeding currently  Review of Systems: as noted in the History of Present Illness.   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 Current Outpatient Medications  Medication Sig Dispense Refill   escitalopram  (LEXAPRO ) 10 MG tablet TAKE 1 TABLET BY MOUTH EVERY DAY 90 tablet 0   ibuprofen  (ADVIL ) 600 MG tablet Take 1 tablet (600 mg total) by mouth every 6 (six) hours as needed. 30 tablet 0   montelukast  (SINGULAIR ) 10 MG tablet Take 1 tablet (10 mg total) by mouth at bedtime. 90 tablet 3   No current facility-administered medications for this visit.    Allergies: is allergic to codeine.  Physical Exam:  BP 116/79   Pulse 93   LMP 04/24/2024  There is no height or weight on file to calculate BMI. General appearance: Well nourished, well developed female in no acute distress.  Abdomen: diffusely non tender to palpation, non distended, and no masses,  hernias Neuro/Psych:  Normal mood and affect.     Radiology: TVUS confirms RN's above suspicion. Vasularity noted with 13mm endometrial stripe  Labs: O POS    Latest Ref Rng & Units 06/15/2024    4:50 PM 06/17/2023    8:31 AM 05/20/2021    5:29 AM  CBC  WBC 4.0 - 10.5 K/uL 12.5  5.9  15.8   Hemoglobin 12.0 - 15.0 g/dL 87.8  87.2  8.7   Hematocrit 36.0 - 46.0 % 36.9  38.8  26.5   Platelets 150 - 400 K/uL 375  356.0  201    Assessment: patient stable  Plan:  1.  Retained products of conception after miscarriage Condolescenes given. I d/w her re: next steps with either expectant management, medications or surgery. Patient amenable to medical management. Instructions and precautions d/w her. Cytotec  800, along with phenergan , motrin  and oxycodone  PRN prescribed. Plan to follow up 1-2wks after she elects to take  2. Von Willebrand's disease Laura [name]) See HPI  Laura Izell Raddle MD Attending Center for [facility])

## 2024-06-22 ENCOUNTER — Encounter

## 2024-06-27 ENCOUNTER — Other Ambulatory Visit: Payer: Self-pay | Admitting: Family Medicine

## 2024-07-06 ENCOUNTER — Ambulatory Visit: Admitting: Family Medicine

## 2024-07-06 ENCOUNTER — Encounter: Payer: Self-pay | Admitting: Family Medicine

## 2024-07-06 VITALS — BP 121/73 | HR 81

## 2024-07-06 DIAGNOSIS — O039 Complete or unspecified spontaneous abortion without complication: Secondary | ICD-10-CM | POA: Diagnosis not present

## 2024-07-06 NOTE — Progress Notes (Signed)
   Subjective:    Patient ID: Laura [name] is a 38 y.o. female presenting with Follow-up (SAB)  on 07/06/2024  HPI: S/p SAB about 2 weeks ago spontaneously.  No further bleeding. No fever/no chills.  Review of Systems  Constitutional:  Negative for chills and fever.  Respiratory:  Negative for shortness of breath.   Cardiovascular:  Negative for chest pain.  Gastrointestinal:  Negative for abdominal pain, nausea and vomiting.  Genitourinary:  Negative for dysuria.  Skin:  Negative for rash.      Objective:    BP 121/73   Pulse 81   LMP 04/24/2024  Physical Exam Exam conducted with a chaperone present.  Constitutional:      General: She is not in acute distress.    Appearance: She is well-developed.  HENT:     Head: Normocephalic and atraumatic.  Eyes:     General: No scleral icterus. Cardiovascular:     Rate and Rhythm: Normal rate.  Pulmonary:     Effort: Pulmonary effort is normal.  Abdominal:     Palpations: Abdomen is soft.  Musculoskeletal:     Cervical back: Neck supple.  Skin:    General: Skin is warm and dry.  Neurological:     Mental Status: She is alert and oriented to person, place, and time.         Assessment & Plan:  SAB (spontaneous abortion) - Doing well. Can attempt pregnancy again when ready. Discussed normal grieving.   Return if symptoms worsen or fail to improve.  Glenys GORMAN Birk, MD 07/06/2024 3:20 PM

## 2024-08-12 ENCOUNTER — Encounter: Payer: Self-pay | Admitting: Obstetrics and Gynecology

## 2024-08-13 ENCOUNTER — Emergency Department (HOSPITAL_COMMUNITY)
Admission: EM | Admit: 2024-08-13 | Discharge: 2024-08-13 | Disposition: A | Discharge status: Home / Self Care | Attending: Emergency Medicine | Admitting: Emergency Medicine

## 2024-08-13 ENCOUNTER — Encounter: Payer: Self-pay | Admitting: Family Medicine

## 2024-08-13 ENCOUNTER — Encounter (HOSPITAL_COMMUNITY): Payer: Self-pay | Admitting: *Deleted

## 2024-08-13 ENCOUNTER — Other Ambulatory Visit: Payer: Self-pay

## 2024-08-13 ENCOUNTER — Emergency Department (HOSPITAL_COMMUNITY)

## 2024-08-13 DIAGNOSIS — N939 Abnormal uterine and vaginal bleeding, unspecified: Secondary | ICD-10-CM | POA: Insufficient documentation

## 2024-08-13 LAB — CBC WITH DIFFERENTIAL/PLATELET
Abs Immature Granulocytes: 0.04 K/uL (ref 0.00–0.07)
Basophils Absolute: 0 K/uL (ref 0.0–0.1)
Basophils Relative: 0 %
Eosinophils Absolute: 0 K/uL (ref 0.0–0.5)
Eosinophils Relative: 0 %
HCT: 33.6 % — ABNORMAL LOW (ref 36.0–46.0)
Hemoglobin: 11.1 g/dL — ABNORMAL LOW (ref 12.0–15.0)
Immature Granulocytes: 1 %
Lymphocytes Relative: 13 %
Lymphs Abs: 1.1 K/uL (ref 0.7–4.0)
MCH: 30 pg (ref 26.0–34.0)
MCHC: 33 g/dL (ref 30.0–36.0)
MCV: 90.8 fL (ref 80.0–100.0)
Monocytes Absolute: 0.7 K/uL (ref 0.1–1.0)
Monocytes Relative: 8 %
Neutro Abs: 6.6 K/uL (ref 1.7–7.7)
Neutrophils Relative %: 78 %
Platelets: 302 K/uL (ref 150–400)
RBC: 3.7 MIL/uL — ABNORMAL LOW (ref 3.87–5.11)
RDW: 12.6 % (ref 11.5–15.5)
WBC: 8.5 K/uL (ref 4.0–10.5)
nRBC: 0 % (ref 0.0–0.2)

## 2024-08-13 LAB — BASIC METABOLIC PANEL WITH GFR
Anion gap: 12 (ref 5–15)
BUN: 9 mg/dL (ref 6–20)
CO2: 20 mmol/L — ABNORMAL LOW (ref 22–32)
Calcium: 8.8 mg/dL — ABNORMAL LOW (ref 8.9–10.3)
Chloride: 103 mmol/L (ref 98–111)
Creatinine, Ser: 0.71 mg/dL (ref 0.44–1.00)
GFR, Estimated: >60 mL/min (ref >60)
Glucose, Bld: 102 mg/dL — ABNORMAL HIGH (ref 70–99)
Potassium: 3.6 mmol/L (ref 3.5–5.1)
Sodium: 135 mmol/L (ref 135–145)

## 2024-08-13 LAB — URINALYSIS, ROUTINE W REFLEX MICROSCOPIC
Bacteria, UA: NONE SEEN
Bilirubin Urine: NEGATIVE
Glucose, UA: NEGATIVE mg/dL
Ketones, ur: 80 mg/dL — AB
Leukocytes,Ua: NEGATIVE
Nitrite: NEGATIVE
Protein, ur: 30 mg/dL — AB
RBC / HPF: >50 RBC/hpf (ref 0–5)
Specific Gravity, Urine: 1.029 (ref 1.005–1.030)
pH: 5 (ref 5.0–8.0)

## 2024-08-13 LAB — PREGNANCY, URINE: Preg Test, Ur: NEGATIVE

## 2024-08-13 NOTE — ED Triage Notes (Signed)
 Pt c/o heavier than normal menstrual cycle and weakness that started on Saturday. Endorses heavy bleeding and multiple clots, having to change her pad Q2 hours. Recent miscarriage in September, states her last cycle was very light.

## 2024-08-13 NOTE — ED Provider Triage Note (Addendum)
 Emergency Medicine Provider Triage Evaluation Note  Laura [name] , a 38 y.o. female  was evaluated in triage.  Pt complains of heavy vaginal bleeding, on menses.  Generalized fatigue, multiple clots, changing pad every 2 hours.  Recent miscarriage in September.  Review of Systems  Positive: Heavy vaginal bleeding, fatigue, dizziness with standing Negative: Nausea, vomiting, fever, chills, pain  Physical Exam  BP 115/78 (BP Location: Right Arm)   Pulse (!) 103   Temp 98.4 F (36.9 C)   Resp 17   Ht 5' 2 (1.575 m)   Wt 57.6 kg   LMP 08/11/2024   SpO2 100%   BMI 23.23 kg/m  Gen:   Awake, no distress   Resp:  Normal effort  MSK:   Moves extremities without difficulty  Other:  Minimally tachycardic  Medical Decision Making  Medically screening exam initiated at 8:22 PM.  Appropriate orders placed.  FRANCENA ZENDER was informed that the remainder of the evaluation will be completed by another provider, this initial triage assessment does not replace that evaluation, and the importance of remaining in the ED until their evaluation is complete.  Labs ordered   Francis Ileana SAILOR, PA-C 08/13/24 2010    Francis Ileana SAILOR, PA-C 08/13/24 2022

## 2024-08-13 NOTE — Discharge Instructions (Addendum)
 As we discussed your ultrasound showed blood products in the uterus which is consistent with vaginal bleeding  You have a ovarian cyst as well.  Please follow-up with your GYN doctor.  Return to ER if you have uncontrolled bleeding or severe pain or vomiting or fever

## 2024-08-13 NOTE — ED Provider Notes (Signed)
 [facility] AT [facility] Provider Note   CSN: [id number] Arrival date & time: 08/13/24  2003     Patient presents with: Vaginal Bleeding   Laura [name] is a 38 y.o. female.   Hx of G2P1001, spontaneous abortion 2 months ago presenting with vaginal bleeding for 5 days.  History per patient.  She endorses following spontaneous abortion 2 months ago, she had a recurrent menstrual period approximately 1 month later.  Lasted for approximately 3 days, light bleeding appreciated.  This is similar time period to her next menstrual cycle, however has lasted 5 days and has had significant bleeding.  Endorses 2 days ago developed significant amount of clot passage and bleeding, however today has significantly lightened.  She is concerned about the amount of bleeding and is developing fatigue.  Denies any syncopal episodes, denies fever or chills.  She endorses she was evaluated by OB following spontaneous abortion, no appreciable products of conception at that time.  Denies nausea or vomiting.   Vaginal Bleeding      Prior to Admission medications   Medication Sig Start Date End Date Taking? Authorizing Provider  escitalopram  (LEXAPRO ) 10 MG tablet TAKE 1 TABLET BY MOUTH EVERY DAY 07/01/24   Fredirick Glenys RAMAN, MD    Allergies: Codeine    Review of Systems  Genitourinary:  Positive for vaginal bleeding.    Updated Vital Signs BP 109/70   Pulse 76   Temp 98.4 F (36.9 C)   Resp 17   Ht 5' 2 (1.575 m)   Wt 57.6 kg   LMP 08/11/2024   SpO2 100%   BMI 23.23 kg/m   Physical Exam Vitals and nursing note reviewed.  Constitutional:      General: She is not in acute distress.    Appearance: She is well-developed.  HENT:     Head: Normocephalic and atraumatic.  Eyes:     Conjunctiva/sclera: Conjunctivae normal.  Cardiovascular:     Rate and Rhythm: Normal rate and regular rhythm.     Heart sounds: No murmur heard. Pulmonary:     Effort: Pulmonary  effort is normal. No respiratory distress.     Breath sounds: Normal breath sounds.  Abdominal:     Palpations: Abdomen is soft.     Tenderness: There is no abdominal tenderness.  Musculoskeletal:        General: No swelling.     Cervical back: Neck supple.  Skin:    General: Skin is warm and dry.     Capillary Refill: Capillary refill takes less than 2 seconds.  Neurological:     Mental Status: She is alert.  Psychiatric:        Mood and Affect: Mood normal.     (all labs ordered are listed, but only abnormal results are displayed) Labs Reviewed  BASIC METABOLIC PANEL WITH GFR - Abnormal; Notable for the following components:      Result Value   CO2 20 (*)    Glucose, Bld 102 (*)    Calcium  8.8 (*)    All other components within normal limits  CBC WITH DIFFERENTIAL/PLATELET - Abnormal; Notable for the following components:   RBC 3.70 (*)    Hemoglobin 11.1 (*)    HCT 33.6 (*)    All other components within normal limits  URINALYSIS, ROUTINE W REFLEX MICROSCOPIC - Abnormal; Notable for the following components:   APPearance HAZY (*)    Hgb urine dipstick LARGE (*)    Ketones, ur  80 (*)    Protein, ur 30 (*)    All other components within normal limits  PREGNANCY, URINE    EKG: None  Radiology: US  PELVIC COMPLETE WITH TRANSVAGINAL Result Date: 08/13/2024 CLINICAL DATA:  Vaginal bleeding.  Miscarriage 05/2024. EXAM: TRANSABDOMINAL AND TRANSVAGINAL ULTRASOUND OF PELVIS TECHNIQUE: Both transabdominal and transvaginal ultrasound examinations of the pelvis were performed. Transabdominal technique was performed for global imaging of the pelvis including uterus, ovaries, adnexal regions, and pelvic cul-de-sac. It was necessary to proceed with endovaginal exam following the transabdominal exam to visualize the ovaries and endometrium. COMPARISON:  None Available. FINDINGS: Uterus Measurements: 6.6 x 4.1 x 5.3 cm = volume: 75 mL. No fibroids or other mass visualized. Endometrium  Thickness: 9.7 mm. The endometrium is heterogeneous. There is a small amount of fluid within the endometrium and minimal vascularity. Right ovary Measurements: 3.0 x 1.3 x 2.6 cm = volume: 5.2 mL. Multiple follicles are seen in the right ovary. Normal appearance/no adnexal mass. Left ovary Measurements: 4.5 x 2.9 x 3.4 cm = volume: 23 mL. There is an anechoic cyst with small daughter cyst in the left ovary measuring 2.9 x 4.1 x 2.2 cm. Other findings No abnormal free fluid. IMPRESSION: 1. The endometrium is heterogeneous with a small amount of fluid and minimal vascularity. Underlying focal endometrial lesion can not be excluded. Retained products of conception are not entirely excluded given the clinical history. 2. 4.1 cm left ovarian benign functional cyst. No follow up imaging recommended. Note: This recommendation does not apply to premenarchal patients or to those with increased risk (genetic, family history, elevated tumor markers or other high-risk factors) of ovarian cancer. Reference: Radiology 2019 Nov; 293(2):359-371. Electronically Signed   By: Greig Pique M.D.   On: 08/13/2024 22:42     Procedures   Medications Ordered in the ED - No data to display                                  Medical Decision Making  Based on patient presentation, history, evaluation, high suspicion for active menstrual period with significantly further bleeding secondary to recent spontaneous abortion secondary to changes in hormones following spontaneous miscarriage.  Patient with initial menstrual cycle with very light amount of bleeding, and then next menstrual cycle with significantly further bleeding, this could be in relation to abnormal hormone levels.  Patient now with further lightening of menstrual bleeding, has only changed her pad once over the past 6 hours.  She endorses that her fatigue is improving, has not had any syncopal episodes, and has been able to walk around without difficulty.  Imaging  overall reassuring with only small amount of bleeding present within the uterus, low suspicion for acute uterine hemorrhage versus DIC versus coagulopathy versus retained products of conception versus pregnancy versus ectopic pregnancy.  Pregnancy test negative today.  Overall with reassuring evaluation today and reassuring imaging, patient is stable for discharge.  Hemoglobin is also stable at this time, low suspicion for acute anemia requiring transfusion.  Recommend follow-up with OB in outpatient setting.  Strict return precautions discussed to the ED.  Follow-up with PCP in 2 to 3 days.     Final diagnoses:  Vaginal bleeding          Arlee Katz, MD 08/13/24 2343    Patt Alm Macho, MD 08/14/24 [phone]

## 2024-10-20 ENCOUNTER — Other Ambulatory Visit (HOSPITAL_COMMUNITY)
Admission: RE | Admit: 2024-10-20 | Discharge: 2024-10-20 | Disposition: A | Source: Ambulatory Visit | Attending: Obstetrics and Gynecology | Admitting: Obstetrics and Gynecology

## 2024-10-20 ENCOUNTER — Ambulatory Visit (INDEPENDENT_AMBULATORY_CARE_PROVIDER_SITE_OTHER): Admitting: Obstetrics and Gynecology

## 2024-10-20 ENCOUNTER — Encounter: Payer: Self-pay | Admitting: Obstetrics and Gynecology

## 2024-10-20 VITALS — BP 111/76 | HR 72 | Ht 62.0 in | Wt 128.2 lb

## 2024-10-20 DIAGNOSIS — Z124 Encounter for screening for malignant neoplasm of cervix: Secondary | ICD-10-CM

## 2024-10-20 DIAGNOSIS — Z01419 Encounter for gynecological examination (general) (routine) without abnormal findings: Secondary | ICD-10-CM

## 2024-10-20 DIAGNOSIS — Z1231 Encounter for screening mammogram for malignant neoplasm of breast: Secondary | ICD-10-CM

## 2024-10-20 NOTE — Progress Notes (Signed)
 Obstetrics and Gynecology Annual Patient Evaluation  Appointment Date: 10/20/2023  OBGYN Clinic: [facility]  Primary Care Provider: Corwin Antu  Chief Complaint:  Chief Complaint  Patient presents with   Gynecologic Exam    History of Present Illness: Laura [name] is a 39 y.o. Caucasian [id number] (Late December 2025), seen for the above chief complaint. Her past medical history is significant for h/o melanoma, vWD.  Patient doing well and no complaints or issues.   Review of Systems: Pertinent items are noted in HPI.   As Per HPI otherwise negative  Patient Active Problem List   Diagnosis Date Noted   History of melanoma 06/17/2023   GAD (generalized anxiety disorder) 04/27/2018   Von Willebrand's disease (HCC) 08/16/2013    Past Medical History:  Past Medical History:  Diagnosis Date   Gastroesophageal reflux disease without esophagitis 04/02/2021   History of chicken pox    Melanoma (HCC)    left upper arm   Von Willebrand disease (HCC)     Past Surgical History:  Past Surgical History:  Procedure Laterality Date   CESAREAN SECTION Bilateral 05/19/2021   Procedure: CESAREAN SECTION;  Surgeon: Cleatus Moccasin, MD;  Location: [facility] LD ORS;  Service: Obstetrics;  Laterality: Bilateral;    Past Obstetrical History:  OB History  Gravida Para Term Preterm AB Living  2 1 1  0 1 1  SAB IAB Ectopic Multiple Live Births  1 0 0 0 1    # Outcome Date GA Lbr Len/2nd Weight Sex Type Anes PTL Lv  2 SAB 06/18/24 [redacted]w[redacted]d         1 Term 05/19/21 [redacted]w[redacted]d 12:50 / 01:22 7 lb 11.6 oz (3.504 kg) F CS-LTranv EPI, Spinal  LIV     Birth Comments: midline facial hemangiomata, nose upper lip   Past Gynecological History: As per HPI. Periods: qmonth, regular, not particularly heavy, approximatley one week History of Pap Smear(s): Yes.   Last pap 09/2022, which was negative and HPV negative She is currently using no method for contraception.   Social  History:  Social History   Socioeconomic History   Marital status: Married    Spouse name: Not on file   Number of children: 1   Years of education: Not on file   Highest education level: Not on file  Occupational History   Occupation: Magazine Features Editor: [employer]    Comment: first grade  Tobacco Use   Smoking status: Never   Smokeless tobacco: Never  Vaping Use   Vaping status: Never Used  Substance and Sexual Activity   Alcohol use: Not Currently   Drug use: No   Sexual activity: Yes    Partners: Male    Birth control/protection: None    Comment: might be trying  Other Topics Concern   Not on file  Social History Narrative   Daugther, age 47, 2024, elly    Social Drivers of Health   Tobacco Use: Low Risk (10/20/2024)   Patient History    Smoking Tobacco Use: Never    Smokeless Tobacco Use: Never    Passive Exposure: Not on file  Financial Resource Strain: Not on file  Food Insecurity: Not on file  Transportation Needs: Not on file  Physical Activity: Not on file  Stress: Not on file  Social Connections: Not on file  Intimate Partner Violence: Not on file  Depression (PHQ2-9): Low Risk (10/20/2024)   Depression (PHQ2-9)    PHQ-2 Score: 0  Alcohol  Screen: Not on file  Housing: Not on file  Utilities: Not on file  Health Literacy: Not on file   Family History:  Family History  Problem Relation Age of Onset   Depression Mother    Anxiety disorder Mother    Cancer Maternal Grandfather        oral   Heart disease Maternal Grandfather    Alcohol abuse Maternal Grandfather    Heart disease Paternal Grandfather    Breast cancer Paternal Aunt 69   Medications Laura [name] had no medications administered during this visit. Current Outpatient Medications  Medication Sig Dispense Refill   escitalopram  (LEXAPRO ) 10 MG tablet TAKE 1 TABLET BY MOUTH EVERY DAY 90 tablet 2   No current facility-administered medications for this visit.    Allergies Codeine   Physical Exam:  BP 111/76   Pulse 72   Ht 5' 2 (1.575 m)   Wt 128 lb 3.2 oz (58.2 kg)   LMP  (LMP Unknown) Comment: Pt reports a few weeks ago  Breastfeeding No   BMI 23.45 kg/m  Body mass index is 23.45 kg/m. General appearance: Well nourished, well developed female in no acute distress.  Respiratory:  Normal respiratory effort Abdomen: positive bowel sounds and no masses, hernias; diffusely non tender to palpation, non distended Breasts: breasts appear normal, no suspicious masses, no skin or nipple changes or axillary nodes, and normal palpation. Small (1-6mm) brown nevus, flat, nttp on underside of left arm triceps (stable) Neuro/Psych:  Normal mood and affect.  Skin:  Warm and dry.  Lymphatic:  No inguinal lymphadenopathy.   Cervical exam performed in the presence of a chaperone Pelvic exam: is not limited by body habitus EGBUS: +normal hair distribution present.  within normal limits. On the right labia majora at 9 o'clock is a symmetric, 5mm very light brown nttp falt lesion c/w benign nevus.  Vagina: within normal limits and with no blood or discharge in the vault Cervix: normal appearing cervix without tenderness, discharge or lesions Uterus:  nonenlarged and non tender Adnexa:  normal adnexa Rectovaginal: deferred Two 1-18mm slightly brown, flat, nevi near right gluteal fold  Laboratory: none  Radiology: none  Assessment: patient stable  Plan:  1. History of melanoma (Primary) Normal female exam. Patient followed by Derm. I don't see that I saw the right vulva spot last year. It appears benign and could have been obscured by hair. I told her I recommend repeat exam in 3-55m with either myself or Derm to ensure stability.   2. Well woman exam with routine gynecological exam Routine care. Desires pap today. Patient amenable to screening mammogram. Normal period in December after heavy one in November. Recommend doing multivitamin or folic  acid in case she conceives.   Bebe Izell Raddle MD Attending Center for [employer] [facility])

## 2024-10-25 ENCOUNTER — Ambulatory Visit: Payer: Self-pay | Admitting: Obstetrics and Gynecology

## 2024-10-25 LAB — CYTOLOGY - PAP
Comment: NEGATIVE
Diagnosis: NEGATIVE
High risk HPV: NEGATIVE

## 2024-11-17 ENCOUNTER — Encounter
